# Patient Record
Sex: Female | Born: 1955
Health system: Southern US, Community
[De-identification: ages and names within clinical notes are randomized; demographics above are authoritative.]

## PROBLEM LIST (undated history)

## (undated) DIAGNOSIS — E663 Overweight: Secondary | ICD-10-CM

## (undated) DIAGNOSIS — S93402A Sprain of unspecified ligament of left ankle, initial encounter: Principal | ICD-10-CM

## (undated) DIAGNOSIS — Z86010 Personal history of colonic polyps: Secondary | ICD-10-CM

## (undated) DIAGNOSIS — K219 Gastro-esophageal reflux disease without esophagitis: Secondary | ICD-10-CM

## (undated) DIAGNOSIS — H269 Unspecified cataract: Secondary | ICD-10-CM

## (undated) DIAGNOSIS — I82409 Acute embolism and thrombosis of unspecified deep veins of unspecified lower extremity: Secondary | ICD-10-CM

## (undated) DIAGNOSIS — K7689 Other specified diseases of liver: Secondary | ICD-10-CM

## (undated) DIAGNOSIS — E042 Nontoxic multinodular goiter: Secondary | ICD-10-CM

## (undated) DIAGNOSIS — K5730 Diverticulosis of large intestine without perforation or abscess without bleeding: Secondary | ICD-10-CM

## (undated) DIAGNOSIS — T7840XA Allergy, unspecified, initial encounter: Secondary | ICD-10-CM

## (undated) DIAGNOSIS — M1990 Unspecified osteoarthritis, unspecified site: Secondary | ICD-10-CM

## (undated) DIAGNOSIS — N809 Endometriosis, unspecified: Secondary | ICD-10-CM

## (undated) DIAGNOSIS — E785 Hyperlipidemia, unspecified: Secondary | ICD-10-CM

## (undated) DIAGNOSIS — E041 Nontoxic single thyroid nodule: Secondary | ICD-10-CM

## (undated) DIAGNOSIS — E559 Vitamin D deficiency, unspecified: Secondary | ICD-10-CM

## (undated) DIAGNOSIS — F419 Anxiety disorder, unspecified: Secondary | ICD-10-CM

## (undated) DIAGNOSIS — C801 Malignant (primary) neoplasm, unspecified: Secondary | ICD-10-CM

## (undated) HISTORY — DX: Nontoxic multinodular goiter: E04.2

## (undated) HISTORY — DX: Nontoxic single thyroid nodule: E04.1

## (undated) HISTORY — PX: OTHER SURGICAL HISTORY: SHX169

## (undated) HISTORY — DX: Vitamin D deficiency, unspecified: E55.9

## (undated) HISTORY — DX: Gastro-esophageal reflux disease without esophagitis: K21.9

## (undated) HISTORY — PX: ENDOMETRIAL ABLATION: SHX621

## (undated) HISTORY — DX: Overweight: E66.3

## (undated) HISTORY — PX: THYROID CYST EXCISION: SHX2511

## (undated) HISTORY — DX: Unspecified cataract: H26.9

## (undated) HISTORY — DX: Personal history of colonic polyps: Z86.010

## (undated) HISTORY — DX: Diverticulosis of large intestine without perforation or abscess without bleeding: K57.30

## (undated) HISTORY — DX: Other specified diseases of liver: K76.89

## (undated) HISTORY — DX: Hyperlipidemia, unspecified: E78.5

## (undated) HISTORY — DX: Unspecified osteoarthritis, unspecified site: M19.90

## (undated) HISTORY — DX: Allergy, unspecified, initial encounter: T78.40XA

## (undated) HISTORY — DX: Sprain of unspecified ligament of left ankle, initial encounter: S93.402A

## (undated) HISTORY — DX: Acute embolism and thrombosis of unspecified deep veins of unspecified lower extremity: I82.409

## (undated) HISTORY — PX: COLONOSCOPY: SHX174

## (undated) HISTORY — DX: Malignant (primary) neoplasm, unspecified: C80.1

## (undated) HISTORY — DX: Endometriosis, unspecified: N80.9

## (undated) HISTORY — DX: Anxiety disorder, unspecified: F41.9

---

## 1999-10-23 ENCOUNTER — Other Ambulatory Visit: Admission: RE | Admit: 1999-10-23 | Discharge: 1999-10-23 | Payer: Self-pay | Admitting: Internal Medicine

## 2003-11-07 ENCOUNTER — Ambulatory Visit (HOSPITAL_COMMUNITY): Admission: RE | Admit: 2003-11-07 | Discharge: 2003-11-07 | Payer: Self-pay | Admitting: Surgery

## 2003-11-11 ENCOUNTER — Ambulatory Visit (HOSPITAL_COMMUNITY): Admission: RE | Admit: 2003-11-11 | Discharge: 2003-11-11 | Payer: Self-pay | Admitting: Surgery

## 2003-11-11 ENCOUNTER — Encounter (INDEPENDENT_AMBULATORY_CARE_PROVIDER_SITE_OTHER): Payer: Self-pay | Admitting: *Deleted

## 2004-03-23 ENCOUNTER — Ambulatory Visit (HOSPITAL_COMMUNITY): Admission: RE | Admit: 2004-03-23 | Discharge: 2004-03-23 | Payer: Self-pay | Admitting: Endocrinology

## 2005-05-07 ENCOUNTER — Ambulatory Visit (HOSPITAL_COMMUNITY): Admission: RE | Admit: 2005-05-07 | Discharge: 2005-05-07 | Payer: Self-pay | Admitting: Endocrinology

## 2005-08-23 LAB — HM COLONOSCOPY

## 2006-05-02 ENCOUNTER — Encounter: Admission: RE | Admit: 2006-05-02 | Discharge: 2006-05-02 | Payer: Self-pay | Admitting: Endocrinology

## 2006-05-06 ENCOUNTER — Encounter: Admission: RE | Admit: 2006-05-06 | Discharge: 2006-05-06 | Payer: Self-pay | Admitting: Orthopedic Surgery

## 2006-08-23 DIAGNOSIS — K5730 Diverticulosis of large intestine without perforation or abscess without bleeding: Secondary | ICD-10-CM

## 2006-08-23 DIAGNOSIS — Z860100 Personal history of colon polyps, unspecified: Secondary | ICD-10-CM

## 2006-08-23 HISTORY — DX: Personal history of colon polyps, unspecified: Z86.0100

## 2006-08-23 HISTORY — DX: Personal history of colonic polyps: Z86.010

## 2006-08-23 HISTORY — DX: Diverticulosis of large intestine without perforation or abscess without bleeding: K57.30

## 2006-09-14 ENCOUNTER — Ambulatory Visit (HOSPITAL_BASED_OUTPATIENT_CLINIC_OR_DEPARTMENT_OTHER): Admission: RE | Admit: 2006-09-14 | Discharge: 2006-09-14 | Payer: Self-pay | Admitting: Orthopedic Surgery

## 2006-09-20 ENCOUNTER — Encounter: Admission: RE | Admit: 2006-09-20 | Discharge: 2006-09-20 | Payer: Self-pay | Admitting: Orthopedic Surgery

## 2007-04-21 ENCOUNTER — Encounter: Admission: RE | Admit: 2007-04-21 | Discharge: 2007-04-21 | Payer: Self-pay | Admitting: Orthopedic Surgery

## 2007-08-04 ENCOUNTER — Ambulatory Visit: Payer: Self-pay | Admitting: Internal Medicine

## 2007-08-11 ENCOUNTER — Ambulatory Visit: Payer: Self-pay | Admitting: Internal Medicine

## 2007-08-11 ENCOUNTER — Encounter: Payer: Self-pay | Admitting: Internal Medicine

## 2007-08-24 HISTORY — PX: OTHER SURGICAL HISTORY: SHX169

## 2009-07-16 ENCOUNTER — Encounter: Admission: RE | Admit: 2009-07-16 | Discharge: 2009-07-16 | Payer: Self-pay | Admitting: Orthopedic Surgery

## 2010-04-23 HISTORY — PX: OTHER SURGICAL HISTORY: SHX169

## 2010-09-13 ENCOUNTER — Encounter: Payer: Self-pay | Admitting: Orthopedic Surgery

## 2010-11-24 ENCOUNTER — Ambulatory Visit (INDEPENDENT_AMBULATORY_CARE_PROVIDER_SITE_OTHER): Payer: Commercial Indemnity | Admitting: Family Medicine

## 2010-11-24 ENCOUNTER — Encounter: Payer: Self-pay | Admitting: Family Medicine

## 2010-11-24 DIAGNOSIS — K3189 Other diseases of stomach and duodenum: Secondary | ICD-10-CM

## 2010-11-24 DIAGNOSIS — E785 Hyperlipidemia, unspecified: Secondary | ICD-10-CM

## 2010-11-24 DIAGNOSIS — K219 Gastro-esophageal reflux disease without esophagitis: Secondary | ICD-10-CM

## 2010-11-24 DIAGNOSIS — J302 Other seasonal allergic rhinitis: Secondary | ICD-10-CM

## 2010-11-24 DIAGNOSIS — K5900 Constipation, unspecified: Secondary | ICD-10-CM

## 2010-11-24 DIAGNOSIS — R1032 Left lower quadrant pain: Secondary | ICD-10-CM

## 2010-11-24 DIAGNOSIS — C801 Malignant (primary) neoplasm, unspecified: Secondary | ICD-10-CM

## 2010-11-24 DIAGNOSIS — J309 Allergic rhinitis, unspecified: Secondary | ICD-10-CM

## 2010-11-24 DIAGNOSIS — E041 Nontoxic single thyroid nodule: Secondary | ICD-10-CM

## 2010-11-24 DIAGNOSIS — R1013 Epigastric pain: Secondary | ICD-10-CM

## 2010-11-24 MED ORDER — FAMOTIDINE 20 MG PO TABS: 20.0000 mg | ORAL_TABLET | Freq: Two times a day (BID) | ORAL | Status: DC

## 2010-11-24 MED ORDER — LORATADINE 10 MG PO TABS: 10.0000 mg | ORAL_TABLET | Freq: Every day | ORAL | Status: DC

## 2010-11-24 NOTE — Patient Instructions (Signed)
Abdominal Pain Abdominal pain can be caused by many things. Your caregiver decides the seriousness of your pain by an examination and possibly blood tests and X-rays. Many cases can be observed and treated at home. Most abdominal pain is not caused by a disease and will probably improve without treatment. However, in many cases, more time must pass before a clear cause of the pain can be found. Before that point, it may not be known if you need more testing, or if hospitalization or surgery is needed. HOME CARE INSTRUCTIONS  Do not take laxatives unless directed by your caregiver.   Take pain medicine only as directed by your caregiver.   Only take over-the-counter or prescription medicines for pain, discomfort, or fever as directed by your caregiver.    Slowly move to a bland diet as tolerated.  SEEK IMMEDIATE MEDICAL CARE IF:  The pain does not go away.   You or your child has an oral temperature above 104, not controlled by medicine.   You keep throwing up (vomiting).   The pain is felt only in portions of the abdomen. Pain in the right side could possibly be appendicitis. In an adult, pain in the left lower portion of the abdomen could be colitis or diverticulitis.   You pass bloody or black tarry stools.  MAKE SURE YOU:  Understand these instructions.   Will watch your condition.   Will get help right away if you are not doing well or get worse.  Document Released: 05/19/2005 Document Re-Released: 11/03/2009 [facility] Patient Information 2011 [place], [place].Allergies, Generic Allergies may happen from anything your body is sensitive to. This may be food, medicines, pollens, chemicals, and nearly anything around you in everyday life that produces allergens. An allergen is anything that causes an allergy producing substance. Heredity is often a factor in causing these problems. This means you may have some of the same allergies as your parents. Food allergies happen in all age  groups. Food allergies are some of the most severe and life threatening. Some common food allergies are cow's milk, seafood, eggs, nuts, wheat, and soybeans. SYMPTOMS  Swelling around the mouth.   An itchy red rash or hives.   Vomiting or diarrhea.   Difficulty breathing.  SEVERE ALLERGIC REACTIONS ARE LIFE-THREATENING.  This reaction is called anaphylaxis. It can cause the mouth and throat to swell and cause difficulty with breathing and swallowing. In severe reactions only a trace amount of food (for example, peanut oil in a salad) may cause death within seconds. Seasonal allergies occur in all age groups. These are seasonal because they usually occur during the same season every year. They may be a reaction to molds, grass pollens, or tree pollens. Other causes of problems are house dust mite allergens, pet dander, and mold spores. The symptoms often consist of nasal congestion, a runny itchy nose associated with sneezing, and tearing itchy eyes. There is often an associated itching of the mouth and ears. The problems happen when you come in contact with pollens and other allergens. Allergens are the particles in the air that the body reacts to with an allergic reaction. This causes you to release allergic antibodies. Through a chain of events, these eventually cause you to release histamine into the blood stream. Although it is meant to be protective to the body, it is this release that causes your discomfort. This is why you were given anti-histamines to feel better. If you are unable to pinpoint the offending allergen, it may be  determined by skin or blood testing. Allergies cannot be cured but can be controlled with medicine. Hay fever is a collection of all or some of the seasonal allergy problems. It may often be treated with simple over-the-counter medicine such as diphenhydramine. Take medicine as directed. Do not drink alcohol or drive while taking this medicine. Check with your caregiver or  package insert for child dosages. If these medicines are not effective, there are many new medicines your caregiver can prescribe. Stronger medicine such as nasal spray, eye drops, and corticosteroids may be used if the first things you try do not work well. Other treatments such as immunotherapy or desensitizing injections can be used if all else fails. Follow up with your caregiver if problems continue. These seasonal allergies are usually not life threatening. They are generally more of a nuisance that can often be handled using medicine. HOME CARE INSTRUCTIONS  If unsure what causes a reaction, keep a diary of foods eaten and symptoms that follow. Avoid foods that cause reactions.   If hives or rash are present:   Take medicine as directed.   You may use an over-the-counter antihistamine (diphenhydramine) for hives and itching as needed.   Apply cold compresses (cloths) to the skin or take baths in cool water. Avoid hot baths or showers. Heat will make a rash and itching worse.   If you are severely allergic:   Following a treatment for a severe reaction, hospitalization is often required for closer follow-up.   Wear a medic-alert bracelet or necklace stating the allergy.   You and your family must learn how to give adrenaline or use an anaphylaxis kit.   If you have had a severe reaction, always carry your anaphylaxis kit or EpiPen with you. Use this medicine as directed by your caregiver if a severe reaction is occurring. Failure to do so could have a fatal outcome.  SEE YOUR CAREGIVER IF:  You suspect a food allergy. Symptoms generally happen within 30 minutes of eating a food.   Your symptoms have not gone away within 2 days or are getting worse.   You develop new symptoms.   You want to retest yourself or your child with a food or drink you think causes an allergic reaction. Never do this if an anaphylactic reaction to that food or drink has happened before. Only do this  under the care of a caregiver.  SEEK IMMEDIATE MEDICAL CARE IF:  You have difficulty breathing, are wheezing, or have a tight feeling in your chest or throat.   You have a swollen mouth, or you have hives, swelling, or itching all over your body.   You have had a severe reaction that has responded to your anaphylaxis kit or an EpiPen. These reactions may return when the medicine has worn off. These reactions should be considered life threatening.  MAKE SURE YOU:   Understand these instructions.   Will watch your condition.   Will get help right away if you are not doing well or get worse.  Document Released: 11/02/2002 Document Re-Released: 08/31/2009 [facility] Patient Information 2011 [place], [place].Esophagitis (Heartburn) Esophagitis (heartburn) is a painful, burning sensation in the chest. It may feel worse in certain positions, such as lying down or bending over. It is caused by stomach acid backing up into the tube that carries food from the mouth down to the stomach (lower esophagus). TREATMENT There are a number of non-prescription medicines used to treat heartburn, including:  Antacids.   Acid reducers (also  called H-2 blockers).   Proton-pump inhibitors.  HOME CARE INSTRUCTIONS  Raise the head of your bead by putting blocks under the legs.   Eat 2-3 hours before going to bed.   Stop smoking.   Try to reach and maintain a healthy weight.   Do not eat just a few very large meals. Instead, eat many smaller meals throughout the day.   Try to identify foods and beverages that make your symptoms worse, and avoid these.   Avoid tight clothing.   Do not exercise right after eating.  SEEK IMMEDIATE MEDICAL CARE IF YOU:  Have severe chest pain that goes down your arm, or into your jaw or neck.   Feel sweaty, dizzy, or lightheaded.   Are short of breath.   Throw up (vomit) blood.   Have difficulty or pain with swallowing.   Have bloody or black, tarry stools.    Have bouts of heartburn more than three times a week for more than two weeks.  Document Released: 09/16/2004 Document Re-Released: 11/03/2009 [facility] Patient Information 2011 [place], [place]. Ok to alternate Ibuprofen and Tylenol for pain relief and seek care if no improvment

## 2010-11-25 ENCOUNTER — Encounter: Payer: Self-pay | Admitting: Family Medicine

## 2010-11-25 ENCOUNTER — Telehealth: Payer: Self-pay | Admitting: Family Medicine

## 2010-11-25 DIAGNOSIS — K5909 Other constipation: Secondary | ICD-10-CM | POA: Insufficient documentation

## 2010-11-25 DIAGNOSIS — E041 Nontoxic single thyroid nodule: Secondary | ICD-10-CM | POA: Insufficient documentation

## 2010-11-25 DIAGNOSIS — J302 Other seasonal allergic rhinitis: Secondary | ICD-10-CM | POA: Insufficient documentation

## 2010-11-25 DIAGNOSIS — E785 Hyperlipidemia, unspecified: Secondary | ICD-10-CM | POA: Insufficient documentation

## 2010-11-25 DIAGNOSIS — C801 Malignant (primary) neoplasm, unspecified: Secondary | ICD-10-CM | POA: Insufficient documentation

## 2010-11-25 DIAGNOSIS — K219 Gastro-esophageal reflux disease without esophagitis: Secondary | ICD-10-CM | POA: Insufficient documentation

## 2010-11-25 HISTORY — DX: Hyperlipidemia, unspecified: E78.5

## 2010-11-25 LAB — RENAL FUNCTION PANEL
Albumin: 4.1 g/dL (ref 3.5–5.2)
BUN: 9 mg/dL (ref 6–23)
CO2: 31 mEq/L (ref 19–32)
Calcium: 9.3 mg/dL (ref 8.4–10.5)
Chloride: 103 mEq/L (ref 96–112)

## 2010-11-25 LAB — URINALYSIS, ROUTINE W REFLEX MICROSCOPIC
Ketones, ur: NEGATIVE
Specific Gravity, Urine: 1.015 (ref 1.000–1.030)
Total Protein, Urine: NEGATIVE
Urine Glucose: NEGATIVE
pH: 7 (ref 5.0–8.0)

## 2010-11-25 NOTE — Assessment & Plan Note (Signed)
Previously aspirated and has not recurred

## 2010-11-25 NOTE — Telephone Encounter (Signed)
Patient called wanting to know about her radiology appointment and antibiotic.

## 2010-11-25 NOTE — Assessment & Plan Note (Signed)
Recently worsened,  Recommend avoid offending foods, increase Pepcid to bid and check a H Pylori. Report worsening symptoms

## 2010-11-25 NOTE — Assessment & Plan Note (Signed)
Patient follows with Dermatology annually and as needed, Dr Dinah Beers office. Encouraged her to continue the same, keep skin protected from sun.

## 2010-11-25 NOTE — Telephone Encounter (Signed)
Pt would like to wait until tomorrow when labs come back

## 2010-11-25 NOTE — Assessment & Plan Note (Signed)
Patient presently taking 4 Docusate Senna tabs daily if she does that she is able to move her bowels most days, if she does not she will go at least 3 days without a BM, encouraged her to add a fiber supplement daily with Metamucil or Benefiber and a probiotic such as Align or yogurt daily, maintain adequate hydration and exercise.

## 2010-11-25 NOTE — Progress Notes (Signed)
Sandra Gonzales [id number] [birth year] 11/25/2010      Progress Note New Patient  Subjective  Chief Complaint  Chief Complaint  Patient presents with  . Abdominal Pain    lower left abd X off and on 4-5 weeks  . Establish Care    new pt    HPI  Patient is in today for an urgent new patient appointment. Her biggest concern is some intermittent left lower artery pain. She reports the pain has been present off and on for about a month. Can last a day or more and happens roughly twice a week she notes it is slightly less uncomfortable she is standing up or lying down. Sitting increases the pain and bending is the worst. She says it localizes only the left lower quadrant he's had no other abdominal pain. She denies anorexia fevers, chills, change in bowel or bladder habits recently although she does have a long-standing history of constipation and she denies any radicular symptoms numbness or tingling down her legs. She does have poor senna S. daily to manage her constipation and this works on most occasions.  She also describes a 6 month history of worsening heartburn symptoms for which she's been taking Pepcid once daily. This helps to some degree but the symptoms return. She is to minimize spicy and fatty foods does not eat most of the temperature similar symptoms several times a week for which she uses Pepcid. She describes a burning and belching sensation in her substernal and oropharyngeal regions. Of note she did have a 24 hour period of nausea, chills, fevers about 3 weeks ago after coming in contact with a family member who had similar symptoms  Past Medical History  Diagnosis Date  . Allergy     hay fever  . Cancer     BCC on L lower eyelid, R nasal bridge &back  . GERD (gastroesophageal reflux disease)   . Overweight   . Constipation   . Benign thyroid cyst     previously drained, has not recurred  . Hyperlipidemia 11/25/2010    Past Surgical History  Procedure Date  . Basal  cell left lower eyelid 9/11    bcc removed from R nasal bridge and back also  . Torn miniscus left knee 1-09  . Thyroid cyst excision     aspirated  . Endometrial ablation 8 years ago    For clotting, no further bleeding    Family History  Problem Relation Age of Onset  . Other Father     Congestive heart failure  . Diabetes Father   . Diabetes Brother     controlled w/ diet  . Cancer Brother     basal cell  . Diabetes Paternal Grandmother     leg amputation  . Leukemia Paternal Grandfather     History   Social History  . Marital Status: Married    Spouse Name: N/A    Number of Children: N/A  . Years of Education: N/A   Occupational History  . Not on file.   Social History Main Topics  . Smoking status: Never Smoker   . Smokeless tobacco: Never Used  . Alcohol Use: No  . Drug Use: No  . Sexually Active: Yes -- Female partner(s)   Other Topics Concern  . Not on file   Social History Narrative  . No narrative on file    No current outpatient prescriptions on file prior to visit.    Allergies  Allergen Reactions  .  Sulfa Drugs Cross Reactors     Review of Systems  Review of Systems  Constitutional: Negative for fever, chills and malaise/fatigue.  HENT: Negative for hearing loss, nosebleeds and congestion.   Eyes: Negative for discharge.  Respiratory: Negative for cough, sputum production, shortness of breath and wheezing.   Cardiovascular: Negative for chest pain, palpitations and leg swelling.  Gastrointestinal: Positive for heartburn, nausea and constipation. Negative for vomiting, abdominal pain, diarrhea and blood in stool.       LLQ pain off and on for past month, last night it worsened to a 8 out of 10 and she could not find a comfortable position. Lying down and standing up were somewhat better than sitting and bending was the worst. She denies any f/c/n/v/recent change in bowel habits/anorexia or GU symptoms. Did have a 24 hour period of  nausea/fevers/chills about 3 weeks ago, her pain had been present prior to that  Genitourinary: Negative for dysuria, urgency, frequency and hematuria.  Musculoskeletal: Negative for myalgias, back pain and falls.  Skin: Negative for rash.  Neurological: Negative for dizziness, tremors, sensory change, focal weakness, loss of consciousness, weakness and headaches.  Endo/Heme/Allergies: Negative for polydipsia. Does not bruise/bleed easily.  Psychiatric/Behavioral: Negative for depression and suicidal ideas. The patient is not nervous/anxious and does not have insomnia.     Objective  BP 112/73  Pulse 67  Temp(Src) 98.1 F (36.7 C) (Oral)  Ht 5' 3.25" (1.607 m)  Wt 177 lb 6.4 oz (80.468 kg)  BMI 31.18 kg/m2  SpO2 98%  Physical Exam  Physical Exam  Constitutional: She is oriented to person, place, and time and well-developed, well-nourished, and in no distress. No distress.  HENT:  Head: Normocephalic and atraumatic.  Right Ear: External ear normal.  Left Ear: External ear normal.  Nose: Nose normal.  Mouth/Throat: Oropharynx is clear and moist. No oropharyngeal exudate.  Eyes: Conjunctivae are normal. Pupils are equal, round, and reactive to light. Right eye exhibits no discharge. Left eye exhibits no discharge. No scleral icterus.  Neck: Normal range of motion. Neck supple. No thyromegaly present.  Cardiovascular: Normal rate, regular rhythm, normal heart sounds and intact distal pulses.   No murmur heard. Pulmonary/Chest: Effort normal and breath sounds normal. No respiratory distress. She has no wheezes. She has no rales.  Abdominal: Soft. Bowel sounds are normal. She exhibits no distension and no mass. There is tenderness.    Musculoskeletal: Normal range of motion. She exhibits no edema and no tenderness.       Right hip: She exhibits normal strength, no tenderness and no crepitus.       Left hip: She exhibits normal strength, no tenderness and no deformity.    Lymphadenopathy:    She has no cervical adenopathy.  Neurological: She is alert and oriented to person, place, and time. She has normal reflexes. No cranial nerve deficit. Coordination normal.  Skin: Skin is warm and dry. No rash noted. She is not diaphoretic.  Psychiatric: Mood, memory and affect normal.       Assessment & Plan  GERD (gastroesophageal reflux disease) Recently worsened,  Recommend avoid offending foods, increase Pepcid to bid and check a H Pylori. Report worsening symptoms  Constipation Patient presently taking 4 Docusate Senna tabs daily if she does that she is able to move her bowels most days, if she does not she will go at least 3 days without a BM, encouraged her to add a fiber supplement daily with Metamucil or Benefiber and a probiotic  such as Align or yogurt daily, maintain adequate hydration and exercise.  Cancer Patient follows with Dermatology annually and as needed, Dr Dinah Beers office. Encouraged her to continue the same, keep skin protected from sun.  Hyperlipidemia Patient notes minimal history in the past will check FLP prior to next visit and she is encouraged to avoid trans fats.  Benign thyroid cyst Previously aspirated and has not recurred  Seasonal allergies Recent flare in symptoms recommend Loratadine daily and report if symptoms recur

## 2010-11-25 NOTE — Assessment & Plan Note (Signed)
Patient notes minimal history in the past will check FLP prior to next visit and she is encouraged to avoid trans fats.

## 2010-11-25 NOTE — Assessment & Plan Note (Signed)
Recent flare in symptoms recommend Loratadine daily and report if symptoms recur

## 2010-11-25 NOTE — Telephone Encounter (Signed)
Unfortunately her labs are not back yet. If her pain is persistent will go ahead and treat Diverticulitis empirically with Ciprofloxacin 250mg  po bid x 10 days, Metronidazole 500mg  po tid x 10 days and she should start a probiotic such as Align caps daily

## 2010-11-26 MED ORDER — CLARITHROMYCIN 500 MG PO TABS: 500.0000 mg | ORAL_TABLET | Freq: Two times a day (BID) | ORAL | Status: DC

## 2010-11-26 MED ORDER — AMOXICILLIN 500 MG PO TABS: 500.0000 mg | ORAL_TABLET | Freq: Two times a day (BID) | ORAL | Status: DC

## 2010-11-26 MED ORDER — OMEPRAZOLE 20 MG PO CPDR: 20.0000 mg | DELAYED_RELEASE_CAPSULE | Freq: Two times a day (BID) | ORAL | Status: DC

## 2010-11-26 NOTE — Progress Notes (Signed)
Addended by: Josph Macho on: 11/26/2010 11:49 AM   Modules accepted: Orders

## 2010-12-07 ENCOUNTER — Other Ambulatory Visit: Payer: Self-pay | Admitting: Family Medicine

## 2010-12-07 ENCOUNTER — Telehealth: Payer: Self-pay

## 2010-12-07 NOTE — Telephone Encounter (Signed)
Pt would like to go ahead and schedule an ultrasound for stomach at [facility] in [place]. Pt can't go this Wed or Thurs (april 18th or 19th). Pt states her abdominal pain is still present after finishing medication.

## 2010-12-07 NOTE — Telephone Encounter (Signed)
I left a message for the patient to CB, when I spoke with the patient earlier she said she wanted to wait until she was done with the antibiotics before she scheduled anything, I also let the patient know in the same message she can schedule the [place] herself ([phone]) since the orders are in Epic. Thanks!

## 2010-12-07 NOTE — Telephone Encounter (Signed)
Patient is having continuing pain in her abdomen, OK to order [place], please notify patient of appt

## 2010-12-11 ENCOUNTER — Ambulatory Visit
Admission: RE | Admit: 2010-12-11 | Discharge: 2010-12-11 | Disposition: A | Discharge status: Home / Self Care | Payer: Commercial Indemnity | Source: Ambulatory Visit | Attending: Family Medicine | Admitting: Family Medicine

## 2010-12-11 DIAGNOSIS — R1032 Left lower quadrant pain: Secondary | ICD-10-CM

## 2011-01-08 NOTE — Op Note (Signed)
Sandra Gonzales, Sandra Gonzales             ACCOUNT NO.:  [id number]   MEDICAL RECORD NO.:  [id number]          PATIENT TYPE:  AMB   LOCATION:  [facility]                         FACILITY:  [facility]   PHYSICIAN:  Ollen Gross, M.D.    DATE OF BIRTH:  1956-01-31   DATE OF PROCEDURE:  09/14/2006  DATE OF DISCHARGE:                               OPERATIVE REPORT   PREOPERATIVE DIAGNOSIS:  Left knee medial meniscal tear.   POSTOPERATIVE DIAGNOSIS:  Left knee medial meniscal tear, plus chondral  defect lateral femoral condyle.   PROCEDURE:  Left knee arthroscopy with medial meniscal debridement  chondroplasty.   SURGEON:  Ollen Gross, M.D.   ASSISTANT:  None.   ANESTHESIA:  General.   ESTIMATED BLOOD LOSS:  Minimal.   DRAINS:  None.   COMPLICATIONS:  None.  Condition stable to recovery.   BRIEF CLINICAL NOTE:  [name] is a 55 year old female several month history worsening  left knee pain, mechanical symptoms.  Exam and history suggested medial  meniscal tear confirmed by MRI.  She has failed nonoperative management  and presents for arthroscopy and debridement.   PROCEDURE:  After successful administration of general anesthetic, a  tourniquet placed high on the left thigh and left lower extremity  prepped and draped in the usual sterile fashion.  Standard superomedial  and inferolateral incisions were made, inflow cannula passed  superomedial, camera passed inferolateral.  Arthroscopic visualization  proceeds.  Undersurface of patella, trochlea looked normal.  The medial  and lateral gutters visualized and no loose bodies.  Flexion valgus  force applied to the knee and medial compartment was entered.  There is  a small tear at the junction of the body and posterior horn of the  medial meniscus.  Spinal needles used to localize the inferomedial  portal small incision made, dilator placed and probe placed.  The tear  is a cleavage type tear which is somewhat unstable.  The rest the  meniscus looks fine.  I debrided the tear back to a stable base with  baskets and 4.2 mm shaver.  It is probed again and found to be stable.  The chondral surfaces and medial femoral condyle, and tibial plateau  looked normal.  The intercondylar notch was visualized, ACL was normal.  Lateral compartment was entered and lateral meniscus is fine.  Lateral  tibia plateau was fine, but on the final lateral femoral condyle there  was an area that appeared to have chondral blistering.  I probed and  indeed it was delaminating from the underlying bone.  It was a small  area about 1 x 1 cm.  I debrided this back to a stable base with stable  cartilaginous edges.  I probed and again and the edges were found to be  stable with no further delamination.  I flexed the knee and there was no  other area that appeared abnormal.  We inspected the rest of the joint.  There were no other loose bodies.  The trochlea and patella.  Patella  looks normal.  The arthroscopic equipment is then removed from the  inferior  portals which were  closed with interrupted 4-0 nylon.  20 mL of 0.25%  Marcaine with epi are injected through the inflow cannula and that is  removed that portal closed with nylon.  A bulky sterile dressing applied  and she is awakened and transferred to recovery in stable condition.      Ollen Gross, M.D.  Electronically Signed     FA/MEDQ  D:  09/14/2006  T:  09/14/2006  Job:  [id number]

## 2011-01-22 HISTORY — PX: ABDOMINAL HYSTERECTOMY: SHX81

## 2011-01-22 LAB — HM PAP SMEAR

## 2011-02-11 LAB — HM PAP SMEAR

## 2012-03-06 ENCOUNTER — Encounter: Payer: Self-pay | Admitting: Family Medicine

## 2012-03-06 ENCOUNTER — Ambulatory Visit (INDEPENDENT_AMBULATORY_CARE_PROVIDER_SITE_OTHER): Payer: Commercial Indemnity | Admitting: Family Medicine

## 2012-03-06 VITALS — BP 131/85 | HR 70 | Temp 99.2°F | Ht 63.25 in | Wt 158.8 lb

## 2012-03-06 DIAGNOSIS — S93402A Sprain of unspecified ligament of left ankle, initial encounter: Secondary | ICD-10-CM | POA: Insufficient documentation

## 2012-03-06 DIAGNOSIS — S93409A Sprain of unspecified ligament of unspecified ankle, initial encounter: Secondary | ICD-10-CM

## 2012-03-06 HISTORY — DX: Sprain of unspecified ligament of left ankle, initial encounter: S93.402A

## 2012-03-06 NOTE — Patient Instructions (Addendum)
Ankle Sprain An ankle sprain is an injury to the strong, fibrous tissues (ligaments) that hold the bones of your ankle joint together.  CAUSES Ankle sprain usually is caused by a fall or by twisting your ankle. People who participate in sports are more prone to these types of injuries.  SYMPTOMS  Symptoms of ankle sprain include:  Pain in your ankle. The pain may be present at rest or only when you are trying to stand or walk.   Swelling.   Bruising. Bruising may develop immediately or within 1 to 2 days after your injury.   Difficulty standing or walking.  DIAGNOSIS  Your caregiver will ask you details about your injury and perform a physical exam of your ankle to determine if you have an ankle sprain. During the physical exam, your caregiver will press and squeeze specific areas of your foot and ankle. Your caregiver will try to move your ankle in certain ways. An X-ray exam may be done to be sure a bone was not broken or a ligament did not separate from one of the bones in your ankle (avulsion).  TREATMENT  Certain types of braces can help stabilize your ankle. Your caregiver can make a recommendation for this. Your caregiver may recommend the use of medication for pain. If your sprain is severe, your caregiver may refer you to a surgeon who helps to restore function to parts of your skeletal system (orthopedist) or a physical therapist. HOME CARE INSTRUCTIONS  Apply ice to your injury for 1 to 2 days or as directed by your caregiver. Applying ice helps to reduce inflammation and pain.  Put ice in a plastic bag.   Place a towel between your skin and the bag.   Leave the ice on for 15 to 20 minutes at a time, every 2 hours while you are awake.   Take over-the-counter or prescription medicines for pain, discomfort, or fever only as directed by your caregiver.   Keep your injured leg elevated, when possible, to lessen swelling.   If your caregiver recommends crutches, use them as  instructed. Gradually, put weight on the affected ankle. Continue to use crutches or a cane until you can walk without feeling pain in your ankle.   If you have a plaster splint, wear the splint as directed by your caregiver. Do not rest it on anything harder than a pillow the first 24 hours. Do not put weight on it. Do not get it wet. You may take it off to take a shower or bath.   You may have been given an elastic bandage to wear around your ankle to provide support. If the elastic bandage is too tight (you have numbness or tingling in your foot or your foot becomes cold and blue), adjust the bandage to make it comfortable.   If you have an air splint, you may blow more air into it or let air out to make it more comfortable. You may take your splint off at night and before taking a shower or bath.   Wiggle your toes in the splint several times per day if you are able.  SEEK MEDICAL CARE IF:   You have an increase in bruising, swelling, or pain.   Your toes feel cold.   Pain relief is not achieved with medication.  SEEK IMMEDIATE MEDICAL CARE IF: Your toes are numb or blue or you have severe pain. MAKE SURE YOU:   Understand these instructions.   Will watch your condition.     Will get help right away if you are not doing well or get worse.  Document Released: 08/09/2005 Document Revised: 07/29/2011 Document Reviewed: 03/13/2008 [facility] Patient Information 2012 [place], [place].   Elevate, ice, aspercreme at least twice a day Aleve twice daily with food for next 5 days and then as needed

## 2012-03-06 NOTE — Progress Notes (Signed)
Patient ID: Sandra Gonzales, female   DOB: 1956-05-05, 56 y.o.   MRN: [id number] Sandra Gonzales [id number] [birth year] 03/06/2012      Progress Note-Follow Up  Subjective  Chief Complaint  Chief Complaint  Patient presents with  . Ankle Pain    hurts to flex left ankle, swollen since Sat.    HPI  Patient is a 56 year old Caucasian female who is in today for left ankle pain. Pain is been present 2-3 days. She denies any trauma falls twists or previous injury. She denies any redness, warmth, fevers, myalgias, anorexia or signs of systemic disease. She said prior to this she did feels well. No chest pain, palpitations, shortness of breath. No personal or family history of gout. She reports maybe she had a bug bite along her left ankle and since then has had some mild swelling with the swelling she notes when she flexes her ankle she has pain in the ankle anteriorly but also radiating up the tibial plateau about halfway. Which relaxes her foot the pain resolves. It is been some mild swelling as well but no other significant symptoms. No numbness tingling or weakness in the rest of the foot.  Past Medical History  Diagnosis Date  . Allergy     hay fever  . Cancer     BCC on L lower eyelid, R nasal bridge &back  . GERD (gastroesophageal reflux disease)   . Overweight   . Constipation   . Benign thyroid cyst     previously drained, has not recurred  . Hyperlipidemia 11/25/2010  . Left ankle sprain 03/06/2012    Past Surgical History  Procedure Date  . Basal cell left lower eyelid 9/11    bcc removed from R nasal bridge and back also  . Torn miniscus left knee 1-09  . Thyroid cyst excision     aspirated  . Endometrial ablation 8 years ago    For clotting, no further bleeding  . Abdominal hysterectomy 01-22-11    still has ovaries    Family History  Problem Relation Age of Onset  . Other Father     Congestive heart failure  . Diabetes Father   . Diabetes Brother     controlled  w/ diet  . Cancer Brother     basal cell  . Diabetes Paternal Grandmother     leg amputation  . Leukemia Paternal Grandfather     History   Social History  . Marital Status: Married    Spouse Name: N/A    Number of Children: N/A  . Years of Education: N/A   Occupational History  . Not on file.   Social History Main Topics  . Smoking status: Never Smoker   . Smokeless tobacco: Never Used  . Alcohol Use: No  . Drug Use: No  . Sexually Active: Yes -- Female partner(s)   Other Topics Concern  . Not on file   Social History Narrative  . No narrative on file    Current Outpatient Prescriptions on File Prior to Visit  Medication Sig Dispense Refill  . estradiol (CLIMARA - DOSED IN MG/24 HR) 0.025 mg/24hr Place 1 patch onto the skin once a week.      . NON FORMULARY Stool softner w/ mild laxative- qd         Allergies  Allergen Reactions  . Sulfa Drugs Cross Reactors     Review of Systems  Review of Systems  Constitutional: Negative for fever and malaise/fatigue.  HENT:  Negative for congestion.   Eyes: Negative for discharge.  Respiratory: Negative for shortness of breath.   Cardiovascular: Negative for chest pain, palpitations and leg swelling.  Gastrointestinal: Negative for nausea, abdominal pain and diarrhea.  Genitourinary: Negative for dysuria.  Musculoskeletal: Positive for joint pain. Negative for falls.       Left ankle pain and swelling  Skin: Negative for rash.  Neurological: Negative for loss of consciousness and headaches.  Endo/Heme/Allergies: Negative for polydipsia.  Psychiatric/Behavioral: Negative for depression and suicidal ideas. The patient is not nervous/anxious and does not have insomnia.     Objective  BP 131/85  Pulse 70  Temp 99.2 F (37.3 C) (Temporal)  Ht 5' 3.25" (1.607 m)  Wt 158 lb 12.8 oz (72.031 kg)  BMI 27.91 kg/m2  SpO2 97%  Physical Exam  Physical Exam  Constitutional: She is oriented to person, place, and time and  well-developed, well-nourished, and in no distress. No distress.  HENT:  Head: Normocephalic and atraumatic.  Eyes: Conjunctivae are normal.  Neck: Neck supple. No thyromegaly present.  Cardiovascular: Normal rate, regular rhythm and normal heart sounds.   No murmur heard. Pulmonary/Chest: Effort normal and breath sounds normal. She has no wheezes.  Abdominal: She exhibits no distension and no mass.  Musculoskeletal: She exhibits edema and tenderness.       Trace swelling over anterior ankle, pain with flexion of ankle  Lymphadenopathy:    She has no cervical adenopathy.  Neurological: She is alert and oriented to person, place, and time.  Skin: Skin is warm and dry. No rash noted. She is not diaphoretic.  Psychiatric: Memory, affect and judgment normal.    Lab Results  Component Value Date   TSH 0.19* 11/24/2010   No results found for this basename: WBC, HGB, HCT, MCV, PLT   Lab Results  Component Value Date   CREATININE 0.6 11/24/2010   BUN 9 11/24/2010   NA 142 11/24/2010   K 4.4 11/24/2010   CL 103 11/24/2010   CO2 31 11/24/2010     Assessment & Plan  Left ankle sprain Denies trauma, warmth or signs of infection. Symptoms present for about 2 days. Possible bug bite before swelling occurred. Now has pain over top of base of foot and up anterior tibial plateau with flexion of ankle. No sign of infection but some mild swelling noted. Wrapped in ace bandage and she is encouraged to elevate, ice and apply aspercreme, report if no improvement. Given samples of Aleve to use bid for the next week and then as needed

## 2012-03-06 NOTE — Assessment & Plan Note (Addendum)
Denies trauma, warmth or signs of infection. Symptoms present for about 2 days. Possible bug bite before swelling occurred. Now has pain over top of base of foot and up anterior tibial plateau with flexion of ankle. No sign of infection but some mild swelling noted. Wrapped in ace bandage and she is encouraged to elevate, ice and apply aspercreme, report if no improvement. Given samples of Aleve to use bid for the next week and then as needed

## 2012-04-06 ENCOUNTER — Encounter: Payer: Self-pay | Admitting: Internal Medicine

## 2012-04-06 ENCOUNTER — Ambulatory Visit (INDEPENDENT_AMBULATORY_CARE_PROVIDER_SITE_OTHER): Payer: Commercial Indemnity | Admitting: Internal Medicine

## 2012-04-06 VITALS — BP 132/74 | HR 60 | Ht 63.25 in | Wt 155.0 lb

## 2012-04-06 DIAGNOSIS — K5900 Constipation, unspecified: Secondary | ICD-10-CM

## 2012-04-06 DIAGNOSIS — R1032 Left lower quadrant pain: Secondary | ICD-10-CM

## 2012-04-06 DIAGNOSIS — K5730 Diverticulosis of large intestine without perforation or abscess without bleeding: Secondary | ICD-10-CM

## 2012-04-06 DIAGNOSIS — Z86010 Personal history of colonic polyps: Secondary | ICD-10-CM

## 2012-04-06 NOTE — Patient Instructions (Addendum)
You have been scheduled for a CT scan of the abdomen and pelvis at [facility] ([address]---this is in the same building as [facility]).   You are scheduled on 04-11-12 at 4:00. You should arrive 15 minutes prior to your appointment time for registration. Please follow the written instructions below on the day of your exam:  WARNING: IF YOU ARE ALLERGIC TO IODINE/X-RAY DYE, PLEASE NOTIFY RADIOLOGY IMMEDIATELY AT [phone]! YOU WILL BE GIVEN A 13 HOUR PREMEDICATION PREP.  1) Do not eat or drink anything after 12:00pm (4 hours prior to your test) 2) You have been given 2 bottles of oral contrast to drink. The solution may taste better if refrigerated, but do NOT add ice or any other liquid to this solution. Shake well before drinking.    Drink 1 bottle of contrast @ 2:00 (2 hours prior to your exam)  Drink 1 bottle of contrast @ 3:00 (1 hour prior to your exam)  You may take any medications as prescribed with a small amount of water except for the following: Metformin, Glucophage, Glucovance, Avandamet, Riomet, Fortamet, Actoplus Met, Janumet, Glumetza or Metaglip. The above medications must be held the day of the exam AND 48 hours after the exam.  The purpose of you drinking the oral contrast is to aid in the visualization of your intestinal tract. The contrast solution may cause some diarrhea. Before your exam is started, you will be given a small amount of fluid to drink. Depending on your individual set of symptoms, you may also receive an intravenous injection of x-ray contrast/dye. Plan on being at [facility] for 30 minutes or long, depending on the type of exam you are having performed.  If you have any questions regarding your exam or if you need to reschedule, you may call the CT department at [phone] between the hours of 8:00 am and 5:00 pm, Monday-Friday.  ________________________________________________________________________

## 2012-04-09 ENCOUNTER — Encounter: Payer: Self-pay | Admitting: Internal Medicine

## 2012-04-09 NOTE — Progress Notes (Signed)
HISTORY OF PRESENT ILLNESS:  Sandra Gonzales is a 56 y.o. female with a history of GERD, basal cell carcinoma, hyperlipidemia, and hyperplastic colon polyps. She presents, at the request of Dr. Evlyn Kanner, today regarding chronic but progressive left lower quadrant pain. I saw the patient in December of 2008 as a direct referral for screening colonoscopy. At that time she was found to have several diminutive rectosigmoid colon polyps which were removed and found to be hyperplastic. Also moderate left-sided diverticulosis. She has not been seen since. Her current history is that of left lower quadrant discomfort or pain which began about 18 months ago. The discomfort occurs daily and is described as focal left lower quadrant dull aching. She does notice that is somewhat better when lying flat, possibly a bit more noticeable while sitting. Is not affected by other types of movement. She does not notice that symptoms are affected by meals. He does seem his symptoms are worse when she is constipated, and somewhat better after a good bowel movement. However, this will not relieve the discomfort entirely. Her bowels have been a bit more constipated recently. She requires daily stool softeners. She has had 25 pound weight loss, though she attributes this to insurance driven incentives and is the result of diet and exercise. She denies change in stool caliber, melena, or hematochezia. Because of the persisting nature of the discomfort she did undergo a gynecology evaluation in April of 2012. At that time, transverse abdominal and transvaginal ultrasound of the pelvis revealed uterine fibroids. She subsequently underwent hysterectomy. Despite this, and given her self enough time to heal, the pain has persisted. She actually thinks it's a bit more frequent and intense as well as noticeable over the past several months. She is concerned. She requires about the need for repeating colonoscopy since it has been about 5  years.  REVIEW OF SYSTEMS:  All non-GI ROS negative except for sinus and allergy trouble, night sweats  Past Medical History  Diagnosis Date  . Allergy     hay fever  . Cancer     BCC on L lower eyelid, R nasal bridge &back  . GERD (gastroesophageal reflux disease)   . Overweight   . Constipation   . Benign thyroid cyst     previously drained, has not recurred  . Hyperlipidemia 11/25/2010  . Left ankle sprain 03/06/2012  . Diverticulosis of colon (without mention of hemorrhage) 2008  . Hx of colonic polyps 2008    Hyperplastic     Past Surgical History  Procedure Date  . Basal cell left lower eyelid 9/11    bcc removed from R nasal bridge and back also  . Torn miniscus left knee 1-09  . Thyroid cyst excision     aspirated  . Endometrial ablation 8 years ago    For clotting, no further bleeding  . Abdominal hysterectomy 01-22-11    still has ovaries    Social History [employer]  reports that she has never smoked. She has never used smokeless tobacco. She reports that she does not drink alcohol or use illicit drugs.  family history includes Cancer in her brother; Diabetes in her brother, father, and paternal grandmother; Leukemia in her paternal grandfather; Lung cancer in her mother; and Other in her father.  There is no history of Colon cancer.  Allergies  Allergen Reactions  . Sulfa Drugs Cross Reactors        PHYSICAL EXAMINATION: Vital signs: BP 132/74  Pulse 60  Ht  5' 3.25" (1.607 m)  Wt 155 lb (70.308 kg)  BMI 27.24 kg/m2  Constitutional: generally well-appearing, no acute distress Psychiatric: alert and oriented x3, cooperative Eyes: extraocular movements intact, anicteric, conjunctiva pink Mouth: oral pharynx moist, no lesions Neck: supple no lymphadenopathy Cardiovascular: heart regular rate and rhythm, no murmur Lungs: clear to auscultation bilaterally Abdomen: soft, mild focal tenderness in the left lower quadrant to palpation, no rebound,  nondistended, no obvious ascites, no peritoneal signs, normal bowel sounds, no organomegaly Rectal:deferred until colonoscopy Extremities: no lower extremity edema bilaterally Skin: no lesions on visible extremities Neuro: No focal deficits. No asterixis.     ASSESSMENT:  #1. Chronic, persistent, and worsening left lower quadrant pain as described. No relief post hysterectomy for uterine fibroids. No diverticulosis and hyperplastic colon polyps on colonoscopy in 2008. Mild tenderness on exam without clear etiology for the discomfort ascertained. #2. Diverticulosis rule out diverticulitis #3. Constipation #4. History of hyperplastic colon polyps   PLAN:  #1. CT scan of the abdomen and pelvis with contrast. Rule out diverticulitis and/or mass lesion #2. Continue stool softeners #3. If CT negative, then proceed with colonoscopy to evaluate pain, constipation, and weight loss (though this is felt to be  Intentional and unrelated).The nature of the procedure, as well as the risks, benefits, and alternatives were carefully and thoroughly reviewed with the patient. Ample time for discussion and questions allowed. The patient understood, was satisfied, and agreed to proceed. Movi prep prescribed. The patient instructed on its use

## 2012-04-11 ENCOUNTER — Ambulatory Visit: Admission: RE | Admit: 2012-04-11 | Payer: Commercial Indemnity | Source: Ambulatory Visit

## 2012-04-11 ENCOUNTER — Ambulatory Visit
Admission: RE | Admit: 2012-04-11 | Discharge: 2012-04-11 | Disposition: A | Discharge status: Home / Self Care | Payer: Commercial Indemnity | Source: Ambulatory Visit | Attending: Internal Medicine | Admitting: Internal Medicine

## 2012-04-11 ENCOUNTER — Other Ambulatory Visit: Payer: Commercial Indemnity

## 2012-04-11 ENCOUNTER — Other Ambulatory Visit: Payer: Self-pay | Admitting: Internal Medicine

## 2012-04-11 DIAGNOSIS — R109 Unspecified abdominal pain: Secondary | ICD-10-CM

## 2012-04-11 MED ORDER — IOHEXOL 300 MG/ML  SOLN
100.0000 mL | Freq: Once | INTRAMUSCULAR | Status: AC | PRN
  Administered 2012-04-11: 100 mL via INTRAVENOUS

## 2012-04-14 ENCOUNTER — Ambulatory Visit (AMBULATORY_SURGERY_CENTER): Payer: Commercial Indemnity | Admitting: *Deleted

## 2012-04-14 VITALS — Ht 63.0 in | Wt 155.0 lb

## 2012-04-14 DIAGNOSIS — R1032 Left lower quadrant pain: Secondary | ICD-10-CM

## 2012-04-14 MED ORDER — MOVIPREP 100 G PO SOLR: ORAL | Status: DC

## 2012-05-04 ENCOUNTER — Ambulatory Visit (AMBULATORY_SURGERY_CENTER): Payer: Commercial Indemnity | Admitting: Internal Medicine

## 2012-05-04 ENCOUNTER — Encounter: Payer: Self-pay | Admitting: Internal Medicine

## 2012-05-04 VITALS — BP 117/65 | HR 60 | Temp 97.8°F | Resp 16 | Ht 63.0 in | Wt 155.0 lb

## 2012-05-04 DIAGNOSIS — R1032 Left lower quadrant pain: Secondary | ICD-10-CM

## 2012-05-04 DIAGNOSIS — Z1211 Encounter for screening for malignant neoplasm of colon: Secondary | ICD-10-CM

## 2012-05-04 DIAGNOSIS — Z86010 Personal history of colonic polyps: Secondary | ICD-10-CM

## 2012-05-04 LAB — HM COLONOSCOPY

## 2012-05-04 MED ORDER — SODIUM CHLORIDE 0.9 % IV SOLN: 500.0000 mL | INTRAVENOUS | Status: DC

## 2012-05-04 NOTE — Progress Notes (Addendum)
Patient did not have preoperative order for IV antibiotic SSI prophylaxis. (G8918)  Patient did not experience any of the following events: a burn prior to discharge; a fall within the facility; wrong site/side/patient/procedure/implant event; or a hospital transfer or hospital admission upon discharge from the facility. (G8907)  

## 2012-05-04 NOTE — Op Note (Signed)
[facility] [address] [place], [address]   COLONOSCOPY PROCEDURE REPORT  PATIENT: Sandra Gonzales, Sandra Gonzales  MR#: [id number] BIRTHDATE: 15-Jan-1956 , 56  yrs. old GENDER: Female ENDOSCOPIST: Roxy Cedar, MD REFERRED [id number] PROCEDURE DATE:  05/04/2012 PROCEDURE:   Colonoscopy, screening ASA CLASS:   Class I INDICATIONS:patient's personal history of colon polyps (HP 2008) and abdominal pain in the lower left quadrant. MEDICATIONS: MAC sedation, administered by CRNA and propofol (Diprivan) 370mg  IV  DESCRIPTION OF PROCEDURE:   After the risks benefits and alternatives of the procedure were thoroughly explained, informed consent was obtained.  A digital rectal exam revealed no abnormalities of the rectum.   The LB [id number]  endoscope was introduced through the anus and advanced to the cecum, which was identified by both the appendix and ileocecal valve. No adverse events experienced.   The quality of the prep was excellent, using MoviPrep  The instrument was then slowly withdrawn as the colon was fully examined.      COLON FINDINGS: Mild melanosis was found throughout the entire examined colon.   Mild diverticulosis was noted in the sigmoid colon.   The colon mucosa was otherwise normal.  Retroflexed views revealed no abnormalities. The time to cecum=2 minutes 51 seconds. Withdrawal time=11 minutes 05 seconds.  The scope was withdrawn and the procedure completed. COMPLICATIONS: There were no complications.  ENDOSCOPIC IMPRESSION: 1.   Mild melanosis was found throughout the entire examined colon 2.   Mild diverticulosis was noted in the sigmoid colon 3.   The colon mucosa was otherwise normal  RECOMMENDATIONS: 1. Continue current colorectal screening recommendations for "routine risk" patients with a repeat colonoscopy in 10 years.   eSigned:  Roxy Cedar, MD 05/04/2012 3:05 PM   cc: Reuel Derby, MD and The Patient

## 2012-05-04 NOTE — Patient Instructions (Addendum)
YOU HAD AN ENDOSCOPIC PROCEDURE TODAY AT THE [facility]: Refer to the procedure report that was given to you for any specific questions about what was found during the examination.  If the procedure report does not answer your questions, please call your gastroenterologist to clarify.  If you requested that your care partner not be given the details of your procedure findings, then the procedure report has been included in a sealed envelope for you to review at your convenience later.  YOU SHOULD EXPECT: Some feelings of bloating in the abdomen. Passage of more gas than usual.  Walking can help get rid of the air that was put into your GI tract during the procedure and reduce the bloating. If you had a lower endoscopy (such as a colonoscopy or flexible sigmoidoscopy) you may notice spotting of blood in your stool or on the toilet paper. If you underwent a bowel prep for your procedure, then you may not have a normal bowel movement for a few days.  DIET: Your first meal following the procedure should be a light meal and then it is ok to progress to your normal diet.  A half-sandwich or bowl of soup is an example of a good first meal.  Heavy or fried foods are harder to digest and may make you feel nauseous or bloated.  Likewise meals heavy in dairy and vegetables can cause extra gas to form and this can also increase the bloating.  Drink plenty of fluids but you should avoid alcoholic beverages for 24 hours.  ACTIVITY: Your care partner should take you home directly after the procedure.  You should plan to take it easy, moving slowly for the rest of the day.  You can resume normal activity the day after the procedure however you should NOT DRIVE or use heavy machinery for 24 hours (because of the sedation medicines used during the test).    SYMPTOMS TO REPORT IMMEDIATELY: A gastroenterologist can be reached at any hour.  During normal business hours, 8:30 AM to 5:00 PM Monday through Friday,  call [phone].  After hours and on weekends, please call the GI answering service at [phone] who will take a message and have the physician on call contact you.   Following lower endoscopy (colonoscopy or flexible sigmoidoscopy):  Excessive amounts of blood in the stool  Significant tenderness or worsening of abdominal pains  Swelling of the abdomen that is new, acute  Fever of 100F or higher  FOLLOW UP: If any biopsies were taken you will be contacted by phone or by letter within the next 1-3 weeks.  Call your gastroenterologist if you have not heard about the biopsies in 3 weeks.  Our staff will call the home number listed on your records the next business day following your procedure to check on you and address any questions or concerns that you may have at that time regarding the information given to you following your procedure. This is a courtesy call and so if there is no answer at the home number and we have not heard from you through the emergency physician on call, we will assume that you have returned to your regular daily activities without incident.  SIGNATURES/CONFIDENTIALITY: You and/or your care partner have signed paperwork which will be entered into your electronic medical record.  These signatures attest to the fact that that the information above on your After Visit Summary has been reviewed and is understood.  Full responsibility of the confidentiality of this   discharge information lies with you and/or your care-partner.   Thank-you for choosing us for your medical needs. 

## 2012-05-05 ENCOUNTER — Telehealth: Payer: Self-pay | Admitting: *Deleted

## 2012-05-05 NOTE — Telephone Encounter (Signed)
  Follow up Call-  Call back number 05/04/2012  Post procedure Call Back phone  # [phone]  Permission to leave phone message Yes     Patient questions:  Do you have a fever, pain , or abdominal swelling? no Pain Score  0 *  Have you tolerated food without any problems? yes  Have you been able to return to your normal activities? yes  Do you have any questions about your discharge instructions: Diet   no Medications  no Follow up visit  no  Do you have questions or concerns about your Care? no  Actions: * If pain score is 4 or above: No action needed, pain <4.

## 2012-05-13 LAB — HM COLONOSCOPY

## 2013-02-28 ENCOUNTER — Ambulatory Visit: Payer: Commercial Indemnity | Admitting: Internal Medicine

## 2013-03-26 ENCOUNTER — Ambulatory Visit (INDEPENDENT_AMBULATORY_CARE_PROVIDER_SITE_OTHER): Payer: Managed Care, Other (non HMO) | Admitting: Internal Medicine

## 2013-03-26 ENCOUNTER — Encounter: Payer: Self-pay | Admitting: Internal Medicine

## 2013-03-26 ENCOUNTER — Other Ambulatory Visit: Payer: Self-pay

## 2013-03-26 VITALS — BP 110/60 | HR 72 | Ht 63.25 in | Wt 174.2 lb

## 2013-03-26 DIAGNOSIS — K219 Gastro-esophageal reflux disease without esophagitis: Secondary | ICD-10-CM

## 2013-03-26 DIAGNOSIS — R1310 Dysphagia, unspecified: Secondary | ICD-10-CM

## 2013-03-26 MED ORDER — OMEPRAZOLE 40 MG PO CPDR: 40.0000 mg | DELAYED_RELEASE_CAPSULE | Freq: Every day | ORAL | Status: DC

## 2013-03-26 NOTE — Patient Instructions (Addendum)
You have been scheduled for an endoscopy with propofol. Please follow written instructions given to you at your visit today. If you use inhalers (even only as needed), please bring them with you on the day of your procedure. Your physician has requested that you go to www.startemmi.com and enter the access code given to you at your visit today. This web site gives a general overview about your procedure. However, you should still follow specific instructions given to you by our office regarding your preparation for the procedure.  We have sent the following medications to your pharmacy for you to pick up at your convenience: Omeprazole  

## 2013-03-26 NOTE — Progress Notes (Signed)
HISTORY OF PRESENT ILLNESS:  Sandra Gonzales is a 57 y.o. female with the below listed medical history who presents today regarding reflux symptoms and dysphagia. The patient was last evaluated in August of 2013 regarding left lower quadrant discomfort. See that dictation. CT scan of the abdomen and pelvis was negative except for scattered diverticula an incidental hepatic cysts. Colonoscopy September 2013 was negative except for diverticulosis and melanosis. Followup in 10 years recommended. The current history is 4 months of intermittent solid food dysphagia. Also problems with pyrosis and hoarseness. Device and her son-in-law, she has been taking Zegerid sporadically. This helps some. She does have chronic stable constipation for which she takes laxative. GI review of systems is otherwise negative REVIEW OF SYSTEMS:  All non-GI ROS negative except for sinus and allergy   Past Medical History  Diagnosis Date  . Allergy     hay fever  . Cancer     BCC on L lower eyelid, R nasal bridge &back  . GERD (gastroesophageal reflux disease)   . Overweight(278.02)   . Constipation   . Benign thyroid cyst     previously drained, has not recurred  . Hyperlipidemia 11/25/2010  . Left ankle sprain 03/06/2012  . Diverticulosis of colon (without mention of hemorrhage) 2008  . Hx of colonic polyps 2008    Hyperplastic   . Multinodular goiter     Past Surgical History  Procedure Laterality Date  . Basal cell left lower eyelid  9/11    bcc removed from R nasal bridge and back also  . Torn miniscus left knee  1-09  . Thyroid cyst excision      aspirated  . Endometrial ablation  8 years ago    For clotting, no further bleeding  . Abdominal hysterectomy  01-22-11    still has ovaries    Social History VERALYN LOPP  reports that she has never smoked. She has never used smokeless tobacco. She reports that she does not drink alcohol or use illicit drugs.  family history includes Cancer in her  brother; Diabetes in her brother, father, and paternal grandmother; Leukemia in her paternal grandfather; Lung cancer in her mother; and Other in her father.  There is no history of Colon cancer and Stomach cancer.  Allergies  Allergen Reactions  . Sulfa Drugs Cross Reactors     hallucinations       PHYSICAL EXAMINATION: Vital signs: BP 110/60  Pulse 72  Ht 5' 3.25" (1.607 m)  Wt 174 lb 4 oz (79.039 kg)  BMI 30.61 kg/m2 General: Well-developed, well-nourished, no acute distress HEENT: Sclerae are anicteric, conjunctiva pink. Oral mucosa intact Lungs: Clear Heart: Regular Abdomen: soft, nontender, nondistended, no obvious ascites, no peritoneal signs, normal bowel sounds. No organomegaly. Extremities: No edema Psychiatric: alert and oriented x3. Cooperative     ASSESSMENT:  #1. GERD #2. Intermittent solid food dysphagia. Rule out peptic stricture #3. Colonoscopy in 2008 and again in 2013 with diverticulosis. No neoplasia.   PLAN:  #1. Reflux precautions #2. Prescribe omeprazole 40 mg daily #3. Upper endoscopy with possible esophageal dilation.The nature of the procedure, as well as the risks, benefits, and alternatives were carefully and thoroughly reviewed with the patient. Ample time for discussion and questions allowed. The patient understood, was satisfied, and agreed to proceed. #4. Routine screening colonoscopy around September 2023

## 2013-04-12 ENCOUNTER — Encounter: Payer: Self-pay | Admitting: Internal Medicine

## 2013-06-07 ENCOUNTER — Encounter: Payer: Managed Care, Other (non HMO) | Admitting: Internal Medicine

## 2013-06-28 ENCOUNTER — Other Ambulatory Visit: Payer: Self-pay | Admitting: Internal Medicine

## 2013-06-28 ENCOUNTER — Encounter: Payer: Self-pay | Admitting: Internal Medicine

## 2013-06-28 ENCOUNTER — Ambulatory Visit (INDEPENDENT_AMBULATORY_CARE_PROVIDER_SITE_OTHER): Payer: Managed Care, Other (non HMO) | Admitting: Internal Medicine

## 2013-06-28 VITALS — BP 116/65 | HR 66 | Temp 98.3°F | Resp 18 | Wt 182.0 lb

## 2013-06-28 DIAGNOSIS — M1990 Unspecified osteoarthritis, unspecified site: Secondary | ICD-10-CM

## 2013-06-28 DIAGNOSIS — Z9079 Acquired absence of other genital organ(s): Secondary | ICD-10-CM

## 2013-06-28 DIAGNOSIS — F419 Anxiety disorder, unspecified: Secondary | ICD-10-CM

## 2013-06-28 DIAGNOSIS — I824Y9 Acute embolism and thrombosis of unspecified deep veins of unspecified proximal lower extremity: Secondary | ICD-10-CM

## 2013-06-28 DIAGNOSIS — Z90710 Acquired absence of both cervix and uterus: Secondary | ICD-10-CM | POA: Insufficient documentation

## 2013-06-28 DIAGNOSIS — N809 Endometriosis, unspecified: Secondary | ICD-10-CM

## 2013-06-28 DIAGNOSIS — N951 Menopausal and female climacteric states: Secondary | ICD-10-CM

## 2013-06-28 DIAGNOSIS — F411 Generalized anxiety disorder: Secondary | ICD-10-CM

## 2013-06-28 DIAGNOSIS — Z86718 Personal history of other venous thrombosis and embolism: Secondary | ICD-10-CM

## 2013-06-28 LAB — LIPID PANEL
LDL Cholesterol: 114 mg/dL — ABNORMAL HIGH (ref 0–99)
VLDL: 31 mg/dL (ref 0–40)

## 2013-06-28 LAB — CBC WITH DIFFERENTIAL/PLATELET
Eosinophils Absolute: 0.2 10*3/uL (ref 0.0–0.7)
Lymphs Abs: 2.4 10*3/uL (ref 0.7–4.0)
MCH: 28.4 pg (ref 26.0–34.0)
Neutrophils Relative %: 58 % (ref 43–77)
Platelets: 233 10*3/uL (ref 150–400)
RBC: 4.65 MIL/uL (ref 3.87–5.11)
WBC: 7.5 10*3/uL (ref 4.0–10.5)

## 2013-06-28 LAB — COMPREHENSIVE METABOLIC PANEL
ALT: 10 U/L (ref 0–35)
Alkaline Phosphatase: 75 U/L (ref 39–117)
CO2: 26 mEq/L (ref 19–32)
Sodium: 139 mEq/L (ref 135–145)
Total Bilirubin: 0.3 mg/dL (ref 0.3–1.2)
Total Protein: 7.4 g/dL (ref 6.0–8.3)

## 2013-06-28 LAB — VITAMIN D 25 HYDROXY (VIT D DEFICIENCY, FRACTURES): Vit D, 25-Hydroxy: 25 ng/mL — ABNORMAL LOW (ref 30–89)

## 2013-06-28 LAB — TSH: TSH: 0.595 u[IU]/mL (ref 0.350–4.500)

## 2013-06-28 MED ORDER — ALPRAZOLAM 0.5 MG PO TABS: ORAL_TABLET | ORAL | Status: DC

## 2013-06-28 NOTE — Patient Instructions (Signed)
Get Chillow cooling pillow   Get I-Cool over the counter  Take one pill daily  Schedule CPE in 2015  GEt 3D mm call [facility]  See me in 6 weeks 30 min

## 2013-06-28 NOTE — Progress Notes (Signed)
Subjective:    Patient ID: Sandra Gonzales, female    DOB: 10-06-1955, 57 y.o.   MRN: [id number]  HPI  Sandra Gonzales is a new pt here for first visit to establish care.  She is the Chiropodist at [employer].    PMH of Anxiety controlled with occasional Xanax ,  DJD of knees,  Basal cell skin cancer,  GERD,  MNG (Dr. Evlyn Kanner)  Endometriosis S/P hysterectomy and L oopherectomy.  She had a  L DVT post arthroscopic procedure.   Yari reports she is having increasing frequency of menopausal hot flushes.  They occur day and night with frequent night time awakening disturbing sleep.  She has a RX for Climara by her GYN but only uses in "infrequently" and not per prescribed directions.    No personal or FH of breast CAncer or other gyn cancers,  No MI,or CVA or liver disease,   .   As stated above pt had a post arthroscopic DVT.  Mother had lung cancer and did have a PE while being treated for lung cancer.  .  She is due for a mammogram and her annual exam.  Pt is S/P hysterectomy for endometriosis - no concern for malignancy or pre-malignancy per pt  Allergies  Allergen Reactions  . Sulfa Drugs Cross Reactors     hallucinations   Past Medical History  Diagnosis Date  . Allergy     hay fever  . Cancer     BCC on L lower eyelid, R nasal bridge &back  . GERD (gastroesophageal reflux disease)   . Overweight(278.02)   . Constipation   . Benign thyroid cyst     previously drained, has not recurred  . Hyperlipidemia 11/25/2010  . Left ankle sprain 03/06/2012  . Diverticulosis of colon (without mention of hemorrhage) 2008  . Hx of colonic polyps 2008    Hyperplastic   . Multinodular goiter    Past Surgical History  Procedure Laterality Date  . Basal cell left lower eyelid  9/11    bcc removed from R nasal bridge and back also  . Torn miniscus left knee  1-09  . Thyroid cyst excision      aspirated  . Endometrial ablation  8 years ago    For clotting, no further bleeding  . Abdominal  hysterectomy  01-22-11    still has ovaries   History   Social History  . Marital Status: Married    Spouse Name: N/A    Number of Children: 2  . Years of Education: N/A   Occupational History  . Assisted Living     Move in coordinator    Social History Main Topics  . Smoking status: Never Smoker   . Smokeless tobacco: Never Used  . Alcohol Use: No  . Drug Use: No  . Sexual Activity: Yes    Partners: Male   Other Topics Concern  . Not on file   Social History Narrative   Daily caffeine   Family History  Problem Relation Age of Onset  . Other Father     Congestive heart failure  . Diabetes Father   . Diabetes Brother     controlled w/ diet  . Cancer Brother     basal cell  . Diabetes Paternal Grandmother     leg amputation  . Leukemia Paternal Grandfather   . Colon cancer Neg Hx   . Stomach cancer Neg Hx   . Lung cancer Mother    Patient Active  Problem List   Diagnosis Date Noted  . Personal history of DVT (deep vein thrombosis) 06/28/2013  . Anxiety 06/28/2013  . DJD (degenerative joint disease) 06/28/2013  . Menopausal hot flushes 06/28/2013  . Endometriosis 06/28/2013  . S/P abdominal hysterectomy and left salpingo-oophorectomy 06/28/2013  . Left ankle sprain 03/06/2012  . Hyperlipidemia 11/25/2010  . Seasonal allergies   . Cancer   . GERD (gastroesophageal reflux disease)   . Constipation   . Benign thyroid cyst    Current Outpatient Prescriptions on File Prior to Visit  Medication Sig Dispense Refill  . Ibuprofen-Diphenhydramine Cit (ADVIL PM PO) Take 1 tablet by mouth at bedtime.      . NON FORMULARY Stool softner w/ mild laxative- qd       . omeprazole (PRILOSEC) 40 MG capsule Take 1 capsule (40 mg total) by mouth daily.  30 capsule  11  . estradiol (CLIMARA - DOSED IN MG/24 HR) 0.025 mg/24hr Place 1 patch onto the skin once a week.       No current facility-administered medications on file prior to visit.      Review of Systems See HPI     Objective:   Physical Exam  Physical Exam  Nursing note and vitals reviewed.  Constitutional: She is oriented to person, place, and time. She appears well-developed and well-nourished.  HENT:  Head: Normocephalic and atraumatic.  Cardiovascular: Normal rate and regular rhythm. Exam reveals no gallop and no friction rub.  No murmur heard.  Pulmonary/Chest: Breath sounds normal. She has no wheezes. She has no rales.  Neurological: She is alert and oriented to person, place, and time.  Skin: Skin is warm and dry.  Psychiatric: She has a normal mood and affect. Her behavior is normal.             Assessment & Plan:  Post menopausal hot flushes:  Worsening in frequency.  Pt is an appropriate candidate with respect to her symptoms.   I explained in great detail the results of the WHI and gave pt risk/benefit sheet.  Must certainly consider her DVT risk,  Noting however  that her DVT was in the setting of  provocation post procedure.  Will check Factor V leiden,  Get UTD mm with all appropriate labs .  Advised cooling pillow and genistein  (I-cool)  OTC for now.  Will further discuss at her CPE visit  Anxiety  OK to refill Xanax .  I discussed with pt ok to use Xanax for occasional insomnia - use only 2-3 times per week  GERD continue prilosec  MNG  Will checK TSH today   DJD of knee

## 2013-06-29 LAB — FACTOR 10 ASSAY: Factor X Activity: 139 % — ABNORMAL HIGH (ref 72–134)

## 2013-07-06 ENCOUNTER — Encounter: Payer: Self-pay | Admitting: *Deleted

## 2013-08-09 ENCOUNTER — Ambulatory Visit: Payer: Managed Care, Other (non HMO) | Admitting: Internal Medicine

## 2013-08-14 ENCOUNTER — Ambulatory Visit (INDEPENDENT_AMBULATORY_CARE_PROVIDER_SITE_OTHER): Payer: Managed Care, Other (non HMO) | Admitting: Internal Medicine

## 2013-08-14 ENCOUNTER — Encounter: Payer: Self-pay | Admitting: Internal Medicine

## 2013-08-14 VITALS — BP 130/80 | HR 72 | Temp 98.3°F | Resp 18

## 2013-08-14 DIAGNOSIS — F419 Anxiety disorder, unspecified: Secondary | ICD-10-CM

## 2013-08-14 DIAGNOSIS — F411 Generalized anxiety disorder: Secondary | ICD-10-CM

## 2013-08-14 DIAGNOSIS — K219 Gastro-esophageal reflux disease without esophagitis: Secondary | ICD-10-CM

## 2013-08-14 DIAGNOSIS — N951 Menopausal and female climacteric states: Secondary | ICD-10-CM

## 2013-08-14 DIAGNOSIS — Z86718 Personal history of other venous thrombosis and embolism: Secondary | ICD-10-CM

## 2013-08-14 DIAGNOSIS — E042 Nontoxic multinodular goiter: Secondary | ICD-10-CM

## 2013-08-14 DIAGNOSIS — Z0000 Encounter for general adult medical examination without abnormal findings: Secondary | ICD-10-CM

## 2013-08-14 DIAGNOSIS — E785 Hyperlipidemia, unspecified: Secondary | ICD-10-CM

## 2013-08-14 LAB — POCT URINALYSIS DIPSTICK
Bilirubin, UA: NEGATIVE
Glucose, UA: NEGATIVE
Nitrite, UA: NEGATIVE

## 2013-08-14 LAB — HEMOCCULT GUIAC POC 1CARD (OFFICE)

## 2013-08-14 NOTE — Patient Instructions (Signed)
Schedule bone density at [facility] with her mammogram    See me as needed  See your GYN  For estrogen patch

## 2013-08-19 DIAGNOSIS — E042 Nontoxic multinodular goiter: Secondary | ICD-10-CM | POA: Insufficient documentation

## 2013-08-19 NOTE — Progress Notes (Signed)
Subjective:    Patient ID: Sandra Gonzales, female    DOB: [birth year], 57 y.o.   MRN: [id number]  HPI  Sandra Gonzales is here for CPE.  MNG  She tells me this is followed by her endocrinologist Dr. Evlyn Kanner.     Menopausal hot flushes.  She has been prescribed a Climara patch by her GYN Dr. Truddie Crumble and she states this has helped quite a bit.  Pt does have a history of post surgical DVT.  She is negative for Factor V  Leiden.  Factor X assay was minimally elevated   GERD and dysphagia  She has seen Dr. Marina Goodell and has minimal  Symptoms on 40 mg of omeprazole.   Upper endoscopy with possible dilation was planned in October but I note procedure was cancelled.   She reports she had a basal cell skin cancer removed by [facility] Surgery of her left eyelid.    Anxiety  She uses Xanax minimally  She is S/P hysterectomy.    Mother had osteoporosis at an early age.    Allergies  Allergen Reactions  . Sulfa Drugs Cross Reactors     hallucinations   Past Medical History  Diagnosis Date  . Allergy     hay fever  . Cancer     BCC on L lower eyelid, R nasal bridge &back  . GERD (gastroesophageal reflux disease)   . Overweight(278.02)   . Constipation   . Benign thyroid cyst     previously drained, has not recurred  . Hyperlipidemia 11/25/2010  . Left ankle sprain 03/06/2012  . Diverticulosis of colon (without mention of hemorrhage) 2008  . Hx of colonic polyps 2008    Hyperplastic   . Multinodular goiter    Past Surgical History  Procedure Laterality Date  . Basal cell left lower eyelid  9/11    bcc removed from R nasal bridge and back also  . Torn miniscus left knee  1-09  . Thyroid cyst excision      aspirated  . Endometrial ablation  8 years ago    For clotting, no further bleeding  . Abdominal hysterectomy  01-22-11    still has ovaries   History   Social History  . Marital Status: Married    Spouse Name: N/A    Number of Children: 2  . Years of Education: N/A   Occupational  History  . Assisted Living     Move in coordinator    Social History Main Topics  . Smoking status: Never Smoker   . Smokeless tobacco: Never Used  . Alcohol Use: No  . Drug Use: No  . Sexual Activity: Yes    Partners: Male   Other Topics Concern  . Not on file   Social History Narrative   Daily caffeine   Family History  Problem Relation Age of Onset  . Other Father     Congestive heart failure  . Diabetes Father   . Diabetes Brother     controlled w/ diet  . Cancer Brother     basal cell  . Diabetes Paternal Grandmother     leg amputation  . Leukemia Paternal Grandfather   . Colon cancer Neg Hx   . Stomach cancer Neg Hx   . Lung cancer Mother    Patient Active Problem List   Diagnosis Date Noted  . Multinodular goiter 08/19/2013  . Personal history of DVT (deep vein thrombosis) 06/28/2013  . Anxiety 06/28/2013  . DJD (degenerative joint disease)  06/28/2013  . Menopausal hot flushes 06/28/2013  . Endometriosis 06/28/2013  . S/P abdominal hysterectomy and left salpingo-oophorectomy 06/28/2013  . Left ankle sprain 03/06/2012  . Hyperlipidemia 11/25/2010  . Seasonal allergies   . Cancer   . GERD (gastroesophageal reflux disease)   . Constipation   . Benign thyroid cyst    Current Outpatient Prescriptions on File Prior to Visit  Medication Sig Dispense Refill  . ALPRAZolam (XANAX) 0.5 MG tablet Take one tablet  At HS  2 times per week  30 tablet  0  . estradiol (CLIMARA - DOSED IN MG/24 HR) 0.025 mg/24hr Place 1 patch onto the skin once a week.      . Ibuprofen-Diphenhydramine Cit (ADVIL PM PO) Take 1 tablet by mouth at bedtime.      . NON FORMULARY Stool softner w/ mild laxative- qd       . omeprazole (PRILOSEC) 40 MG capsule Take 1 capsule (40 mg total) by mouth daily.  30 capsule  11  . OVER THE COUNTER MEDICATION 1 capsule by Per post-pyloric tube route daily. Stool softner       No current facility-administered medications on file prior to visit.       Review of Systems  All other systems reviewed and are negative.       Objective:   Physical Exam Physical Exam  Nursing note and vitals reviewed.  Constitutional: She is oriented to person, place, and time. She appears well-developed and well-nourished.  HENT:  Head: Normocephalic and atraumatic.  Right Ear: Tympanic membrane and ear canal normal. No drainage. Tympanic membrane is not injected and not erythematous.  Left Ear: Tympanic membrane and ear canal normal. No drainage. Tympanic membrane is not injected and not erythematous.  Nose: Nose normal. Right sinus exhibits no maxillary sinus tenderness and no frontal sinus tenderness. Left sinus exhibits no maxillary sinus tenderness and no frontal sinus tenderness.  Mouth/Throat: Oropharynx is clear and moist. No oral lesions. No oropharyngeal exudate.  Eyes: Conjunctivae and EOM are normal. Pupils are equal, round, and reactive to light.  Neck: Normal range of motion. Neck supple. No JVD present. Carotid bruit is not present. No mass and no thyromegaly present.  Cardiovascular: Normal rate, regular rhythm, S1 normal, S2 normal and intact distal pulses. Exam reveals no gallop and no friction rub.  No murmur heard.  Pulses:  Carotid pulses are 2+ on the right side, and 2+ on the left side.  Dorsalis pedis pulses are 2+ on the right side, and 2+ on the left side.  No carotid bruit. No LE edema  Pulmonary/Chest: Breath sounds normal. She has no wheezes. She has no rales. She exhibits no tenderness.   Breast  No discrete masses no nipple discharge no axillary adenopathy bilaterally.  Abdominal: Soft. Bowel sounds are normal. She exhibits no distension and no mass. There is no hepatosplenomegaly. There is no tenderness. There is no CVA tenderness.  Musculoskeletal: Normal range of motion.  No active synovitis to joints.  Lymphadenopathy:  She has no cervical adenopathy.  She has no axillary adenopathy.  Right: No inguinal and no  supraclavicular adenopathy present.  Left: No inguinal and no supraclavicular adenopathy present.  Neurological: She is alert and oriented to person, place, and time. She has normal strength and normal reflexes. She displays no tremor. No cranial nerve deficit or sensory deficit. Coordination and gait normal.  Skin: Skin is warm and dry. No rash noted. No cyanosis. Nails show no clubbing.  Psychiatric: She has  a normal mood and affect. Her speech is normal and behavior is normal. Cognition and memory are normal.           Assessment & Plan:  Health Maintenance:  Had influenza.  Will check with insurance regarding  Zostavax.    Will schedule 3 D mm with bone density    History of MNG  Euthyroid biochemically.  Goiter managed by her endocrinologist Dr. Evlyn Kanner  I cannot palpate any discrete thyroid nodules today on exam  Minimal hyperlipidemia  DASH diet given  Menopausal hot flushes.  On Climara patch per her GYN.   I discussed risk of DVT and that science is unclear if estrogen safe with a history of provoked DVT.  She is factor V Leiden negative.  Discussed evaluation by hematology but pt declines.  She is to discuss length of time of continueation of Climara with her GYN.  History of basal cell skin  CA  Minimal  Vitamin D deficiency  Advised [facility] vitamin D

## 2013-08-20 ENCOUNTER — Telehealth: Payer: Self-pay | Admitting: *Deleted

## 2013-08-20 NOTE — Telephone Encounter (Signed)
Called [place] and left a message on her voicemail with appointment information for her Mammo and Dexa with [facility].  08/24/13 at 11:30 am.  Let her know no metal zippers or buttons.  No calcium supplements day before of day of testing.  No lotions, powders, or deodorant.

## 2013-09-09 ENCOUNTER — Telehealth: Payer: Self-pay | Admitting: Internal Medicine

## 2013-09-09 ENCOUNTER — Encounter: Payer: Self-pay | Admitting: Internal Medicine

## 2013-09-09 DIAGNOSIS — Z9289 Personal history of other medical treatment: Secondary | ICD-10-CM | POA: Insufficient documentation

## 2013-09-09 NOTE — Telephone Encounter (Signed)
Sandra Gonzales  Call pt and let her know that I have received her bone density report.  She has osteopenia  (early bone thinning)  She does not have osteoporosis  She should just take calcium 1200-1500 mg daily and vitamin D [phone] units daily

## 2013-09-10 ENCOUNTER — Telehealth: Payer: Self-pay | Admitting: *Deleted

## 2013-09-10 NOTE — Telephone Encounter (Signed)
Notified pt of bone density results. Advised ot to continue Calcium and Vit D

## 2013-09-12 ENCOUNTER — Encounter: Payer: Self-pay | Admitting: *Deleted

## 2013-09-27 ENCOUNTER — Encounter (HOSPITAL_BASED_OUTPATIENT_CLINIC_OR_DEPARTMENT_OTHER): Payer: Self-pay | Admitting: Emergency Medicine

## 2013-09-27 ENCOUNTER — Emergency Department (HOSPITAL_BASED_OUTPATIENT_CLINIC_OR_DEPARTMENT_OTHER)
Admission: EM | Admit: 2013-09-27 | Discharge: 2013-09-27 | Disposition: A | Discharge status: Home / Self Care | Payer: Managed Care, Other (non HMO) | Attending: Emergency Medicine | Admitting: Emergency Medicine

## 2013-09-27 ENCOUNTER — Emergency Department (HOSPITAL_BASED_OUTPATIENT_CLINIC_OR_DEPARTMENT_OTHER): Payer: Managed Care, Other (non HMO)

## 2013-09-27 DIAGNOSIS — IMO0001 Reserved for inherently not codable concepts without codable children: Secondary | ICD-10-CM | POA: Insufficient documentation

## 2013-09-27 DIAGNOSIS — Z860100 Personal history of colon polyps, unspecified: Secondary | ICD-10-CM | POA: Insufficient documentation

## 2013-09-27 DIAGNOSIS — K219 Gastro-esophageal reflux disease without esophagitis: Secondary | ICD-10-CM | POA: Insufficient documentation

## 2013-09-27 DIAGNOSIS — R42 Dizziness and giddiness: Secondary | ICD-10-CM | POA: Insufficient documentation

## 2013-09-27 DIAGNOSIS — R6889 Other general symptoms and signs: Secondary | ICD-10-CM

## 2013-09-27 DIAGNOSIS — Z8639 Personal history of other endocrine, nutritional and metabolic disease: Secondary | ICD-10-CM | POA: Insufficient documentation

## 2013-09-27 DIAGNOSIS — Z87828 Personal history of other (healed) physical injury and trauma: Secondary | ICD-10-CM | POA: Insufficient documentation

## 2013-09-27 DIAGNOSIS — Z85828 Personal history of other malignant neoplasm of skin: Secondary | ICD-10-CM | POA: Insufficient documentation

## 2013-09-27 DIAGNOSIS — J0100 Acute maxillary sinusitis, unspecified: Secondary | ICD-10-CM | POA: Insufficient documentation

## 2013-09-27 DIAGNOSIS — J111 Influenza due to unidentified influenza virus with other respiratory manifestations: Secondary | ICD-10-CM | POA: Insufficient documentation

## 2013-09-27 DIAGNOSIS — R110 Nausea: Secondary | ICD-10-CM | POA: Insufficient documentation

## 2013-09-27 DIAGNOSIS — R079 Chest pain, unspecified: Secondary | ICD-10-CM | POA: Insufficient documentation

## 2013-09-27 DIAGNOSIS — Z862 Personal history of diseases of the blood and blood-forming organs and certain disorders involving the immune mechanism: Secondary | ICD-10-CM | POA: Insufficient documentation

## 2013-09-27 DIAGNOSIS — Z79899 Other long term (current) drug therapy: Secondary | ICD-10-CM | POA: Insufficient documentation

## 2013-09-27 MED ORDER — PHENYLEPH-PROMETHAZINE-COD 5-6.25-10 MG/5ML PO SYRP: 5.0000 mL | ORAL_SOLUTION | ORAL | Status: DC | PRN

## 2013-09-27 MED ORDER — HYDROCOD POLST-CHLORPHEN POLST 10-8 MG/5ML PO LQCR
5.0000 mL | Freq: Once | ORAL | Status: AC
  Administered 2013-09-27: 5 mL via ORAL
  Filled 2013-09-27: qty 5

## 2013-09-27 MED ORDER — IBUPROFEN 400 MG PO TABS
600.0000 mg | ORAL_TABLET | Freq: Once | ORAL | Status: AC
  Administered 2013-09-27: 600 mg via ORAL
  Filled 2013-09-27 (×2): qty 1

## 2013-09-27 MED ORDER — AMOXICILLIN-POT CLAVULANATE 875-125 MG PO TABS: 1.0000 | ORAL_TABLET | Freq: Two times a day (BID) | ORAL | Status: DC

## 2013-09-27 MED ORDER — IPRATROPIUM-ALBUTEROL 0.5-2.5 (3) MG/3ML IN SOLN
3.0000 mL | RESPIRATORY_TRACT | Status: DC
  Administered 2013-09-27: 3 mL via RESPIRATORY_TRACT
  Filled 2013-09-27: qty 3

## 2013-09-27 NOTE — ED Provider Notes (Signed)
Medical screening examination/treatment/procedure(s) were performed by non-physician practitioner and as supervising physician I was immediately available for consultation/collaboration.  EKG Interpretation   None         Kaesyn Johnston B. Karle Starch, MD 09/27/13 2320

## 2013-09-27 NOTE — Discharge Instructions (Signed)
Take the medication as directed. Do not drive if you are taking the cough medication as it will make you sleepy. Return immediately for worsening symptoms such as severe headache, stiff neck, high fever, persistent vomiting or other problems.

## 2013-09-27 NOTE — ED Provider Notes (Signed)
CSN: [id number]     Arrival date & time 09/27/13  1444 History   First MD Initiated Contact with Patient 09/27/13 1446     Chief Complaint  Patient presents with  . URI   (Consider location/radiation/quality/duration/timing/severity/associated sxs/prior Treatment) Patient is a 58 y.o. female presenting with URI. The history is provided by the patient.  URI Presenting symptoms: congestion and cough   Presenting symptoms: no sore throat   Severity:  Moderate Onset quality:  Gradual Progression:  Worsening Chronicity:  New Relieved by:  Nothing Worsened by:  Nothing tried Ineffective treatments:  OTC medications Associated symptoms: headaches, myalgias, sinus pain, sneezing and wheezing   Associated symptoms: no neck pain   Risk factors: sick contacts   Risk factors: no chronic cardiac disease, no chronic kidney disease, no chronic respiratory disease, no diabetes mellitus and no recent travel    JAKKI DOUGHTY is a 58 y.o. female who presents to the ED with cough and congestion that started 4 days ago.  Past Medical History  Diagnosis Date  . Allergy     hay fever  . Cancer     BCC on L lower eyelid, R nasal bridge &back  . GERD (gastroesophageal reflux disease)   . Overweight   . Constipation   . Benign thyroid cyst     previously drained, has not recurred  . Hyperlipidemia 11/25/2010  . Left ankle sprain 03/06/2012  . Diverticulosis of colon (without mention of hemorrhage) 2008  . Hx of colonic polyps 2008    Hyperplastic   . Multinodular goiter    Past Surgical History  Procedure Laterality Date  . Basal cell left lower eyelid  9/11    bcc removed from R nasal bridge and back also  . Torn miniscus left knee  1-09  . Thyroid cyst excision      aspirated  . Endometrial ablation  8 years ago    For clotting, no further bleeding  . Abdominal hysterectomy  01-22-11    still has ovaries   Family History  Problem Relation Age of Onset  . Other Father     Congestive  heart failure  . Diabetes Father   . Diabetes Brother     controlled w/ diet  . Cancer Brother     basal cell  . Diabetes Paternal Grandmother     leg amputation  . Leukemia Paternal Grandfather   . Colon cancer Neg Hx   . Stomach cancer Neg Hx   . Lung cancer Mother    History  Substance Use Topics  . Smoking status: Never Smoker   . Smokeless tobacco: Never Used  . Alcohol Use: No   OB History   Grav Para Term Preterm Abortions TAB SAB Ect Mult Living   3    1  1   2      Review of Systems  HENT: Positive for congestion, sinus pressure and sneezing. Negative for facial swelling and sore throat.   Eyes: Negative for discharge, itching and visual disturbance.  Respiratory: Positive for cough and wheezing.   Gastrointestinal: Positive for nausea. Negative for abdominal pain and diarrhea. Vomiting: with cough only.  Genitourinary: Negative for dysuria, urgency and frequency.  Musculoskeletal: Positive for myalgias. Negative for neck pain.  Skin: Negative for rash.  Neurological: Positive for light-headedness and headaches. Negative for syncope.  Psychiatric/Behavioral: Negative for confusion. The patient is not nervous/anxious.     Allergies  Sulfa drugs cross reactors  Home Medications   Current Outpatient  Rx  Name  Route  Sig  Dispense  Refill  . ALPRAZolam (XANAX) 0.5 MG tablet      Take one tablet  At HS  2 times per week   30 tablet   0   . estradiol (CLIMARA - DOSED IN MG/24 HR) 0.025 mg/24hr   Transdermal   Place 1 patch onto the skin once a week.         . Ibuprofen-Diphenhydramine Cit (ADVIL PM PO)   Oral   Take 1 tablet by mouth at bedtime.         . NON FORMULARY      Stool softner w/ mild laxative- qd          . omeprazole (PRILOSEC) 40 MG capsule   Oral   Take 1 capsule (40 mg total) by mouth daily.   30 capsule   11   . OVER THE COUNTER MEDICATION   Per post-pyloric tube   1 capsule by Per post-pyloric tube route daily. Stool  softner          BP 130/54  Pulse 81  Temp(Src) 98.1 F (36.7 C) (Oral)  Resp 18  Ht 5\' 3"  (1.6 m)  Wt 182 lb (82.555 kg)  BMI 32.25 kg/m2  SpO2 97% Physical Exam  Nursing note and vitals reviewed. Constitutional: She is oriented to person, place, and time. She appears well-developed and well-nourished. No distress.  HENT:  Head: Normocephalic and atraumatic.  Right Ear: Tympanic membrane normal.  Left Ear: Tympanic membrane normal.  Nose: Mucosal edema and rhinorrhea present. Right sinus exhibits maxillary sinus tenderness. Left sinus exhibits maxillary sinus tenderness.  Mouth/Throat: Uvula is midline, oropharynx is clear and moist and mucous membranes are normal.  Eyes: EOM are normal.  Neck: Neck supple.  Cardiovascular: Normal rate.   Pulmonary/Chest: Effort normal. Wheezes: occasional. She has no rales. She exhibits tenderness.  Abdominal: Soft. There is no tenderness.  Musculoskeletal: Normal range of motion.  Lymphadenopathy:    She has no cervical adenopathy.  Neurological: She is alert and oriented to person, place, and time. No cranial nerve deficit.  Skin: Skin is warm and dry.  Psychiatric: She has a normal mood and affect. Her behavior is normal.   Dg Chest 2 View  09/27/2013   CLINICAL DATA:  Cough, congestion  EXAM: CHEST  2 VIEW  COMPARISON:  None.  FINDINGS: The lungs are clear and negative for focal airspace consolidation, pulmonary edema or suspicious pulmonary nodule. No pleural effusion or pneumothorax. Cardiac and mediastinal contours are within normal limits. No acute fracture or lytic or blastic osseous lesions. The visualized upper abdominal bowel gas pattern is unremarkable.  IMPRESSION: No active cardiopulmonary disease.   Electronically Signed   By: Malachy Moan M.D.   On: 09/27/2013 15:50    ED Course  Procedures   MDM  SUBJECTIVE:  GLANDA COZAD is a 58 y.o. female who present complaining of flu-like symptoms: fevers, chills, myalgias,  congestion, sore throat and cough for 4 days. Denies dyspnea. Also with sinus tenderness.   OBJECTIVE: Appears moderately ill but not toxic; temperature as noted in vitals. Ears normal. Throat and pharynx normal.  Neck supple. No adenopathy in the neck. Sinuses non tender. The chest is clear.  ASSESSMENT: Influenza  PLAN: Symptomatic therapy suggested: rest, increase fluids, gargle prn for sore throat, use mist of vaporizer prn and return if symptoms persist or worsen.  Will treat for bronchitis and sinusitis.  Discussed with the patient and all  questioned fully answered.    Medication List    TAKE these medications       amoxicillin-clavulanate 875-125 MG per tablet  Commonly known as:  AUGMENTIN  Take 1 tablet by mouth 2 (two) times daily.     Phenyleph-Promethazine-Cod 5-6.25-10 MG/5ML Syrp  Commonly known as:  PROMETHAZINE VC/CODEINE  Take 5 mLs by mouth every 4 (four) hours as needed.      ASK your doctor about these medications       ADVIL PM PO  Take 1 tablet by mouth at bedtime.     ALPRAZolam 0.5 MG tablet  Commonly known as:  XANAX  Take one tablet  At HS  2 times per week     estradiol 0.025 mg/24hr patch  Commonly known as:  CLIMARA - Dosed in mg/24 hr  Place 1 patch onto the skin once a week.     NON FORMULARY  Stool softner w/ mild laxative- qd     omeprazole 40 MG capsule  Commonly known as:  PRILOSEC  Take 1 capsule (40 mg total) by mouth daily.     OVER THE COUNTER MEDICATION  1 capsule by Per post-pyloric tube route daily. Stool softner         Ashley [place], [place] 09/27/13 2225

## 2013-09-27 NOTE — ED Notes (Signed)
Pt c/o URi symptoms  with pro cough x 4 days

## 2014-02-02 IMAGING — CT CT ABD-PELV W/ CM
1 of 5 series · 14 of 36 positions shown, 19 images · IV contrast (READICAT/WATER & [ID] OMNI 300)
Comparison: None.

CLINICAL DATA: Chronic but increasing left lower quadrant pain,
history of hysterectomy and left oophorectomy in January 2011

CT ABDOMEN AND PELVIS WITH CONTRAST
TECHNIQUE: Multidetector CT imaging of the abdomen and pelvis was
performed following the standard protocol during bolus
administration of intravenous contrast.
Contrast: 100mL OMNIPAQUE IOHEXOL 300 MG/ML  SOLN

[Series 2: abd/pelvis with · axial · 0.70mm/px · z∈[-362,+18]mm · 14 of 86 slices shown, 19 images]
[im 5/86  soft-tissue]
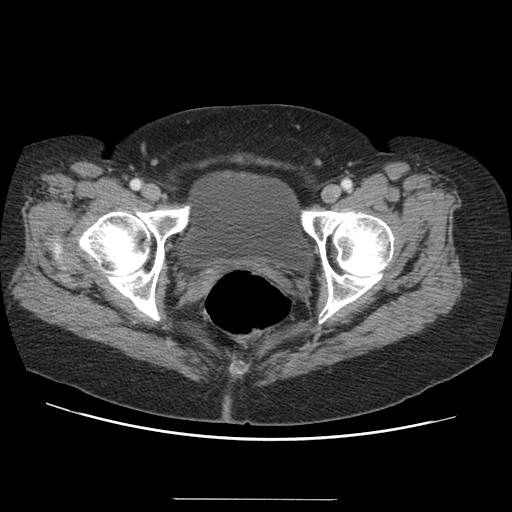
[im 5/86  bone]
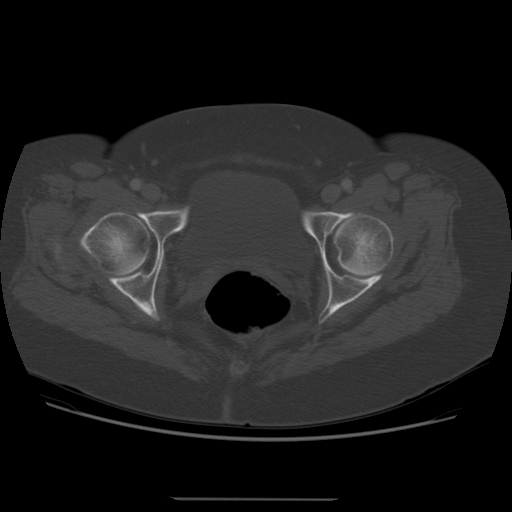
[im 10/86  soft-tissue]
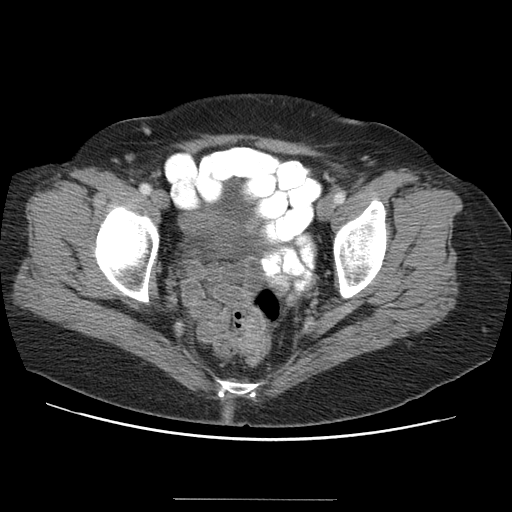
[im 19/86  soft-tissue]
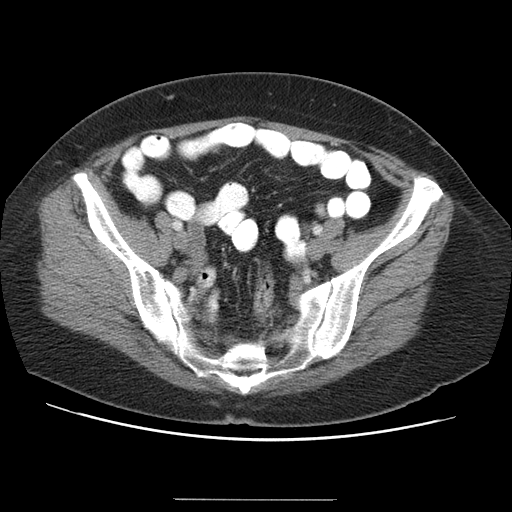
[im 24/86  soft-tissue]
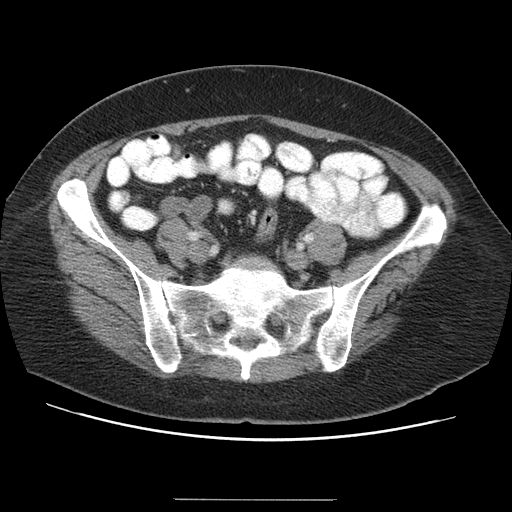
[im 29/86  soft-tissue]
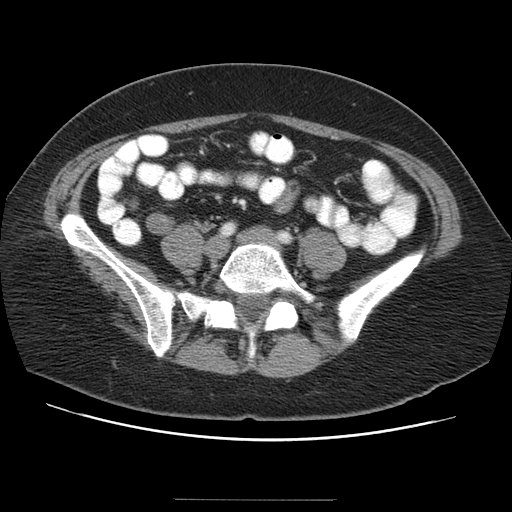
[im 38/86  soft-tissue]
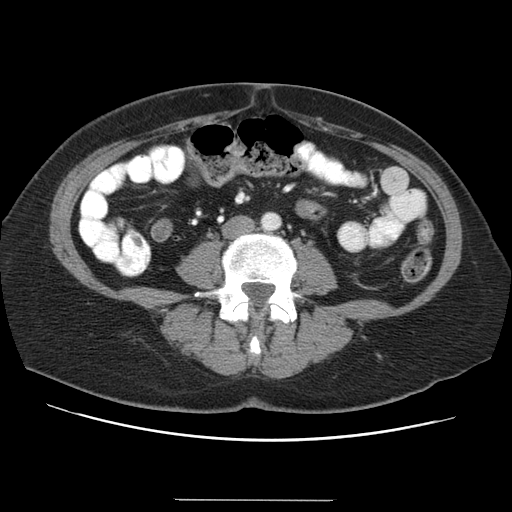
[im 43/86  soft-tissue]
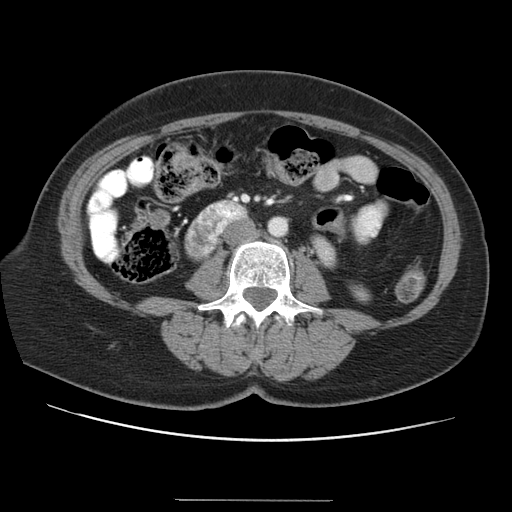
[im 48/86  soft-tissue]
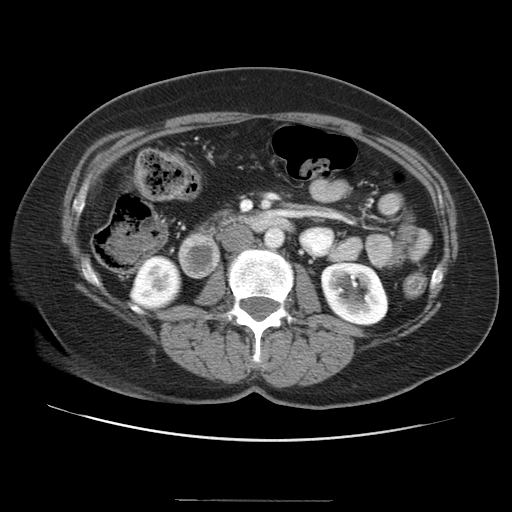
[im 57/86  soft-tissue]
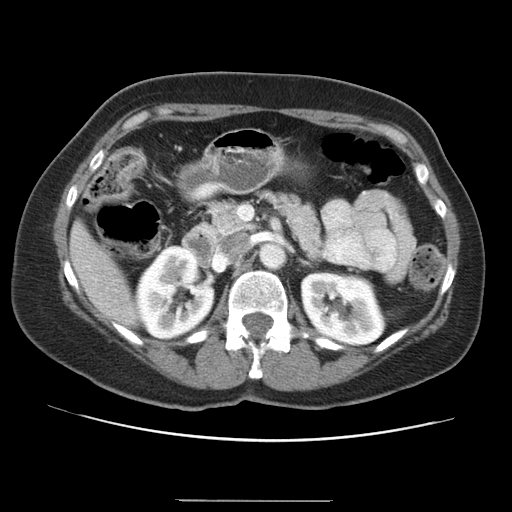
[im 57/86  bone]
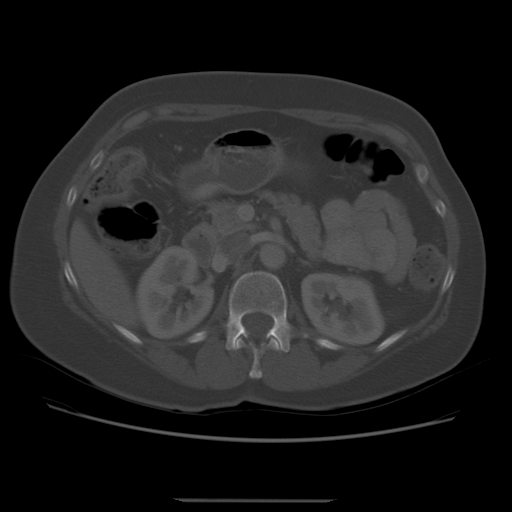
[im 62/86  soft-tissue]
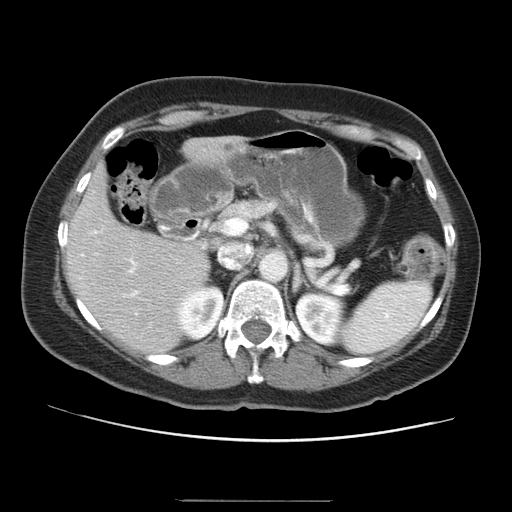
[im 67/86  soft-tissue]
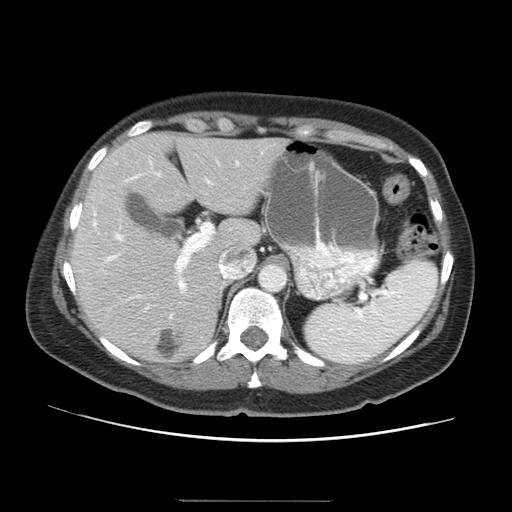
[im 67/86  lung]
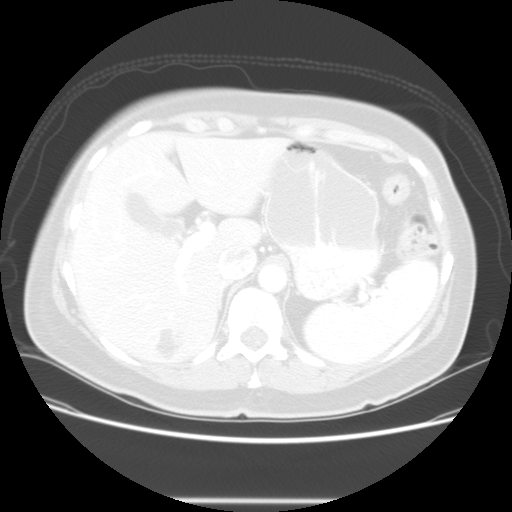
[im 71/86  lung]
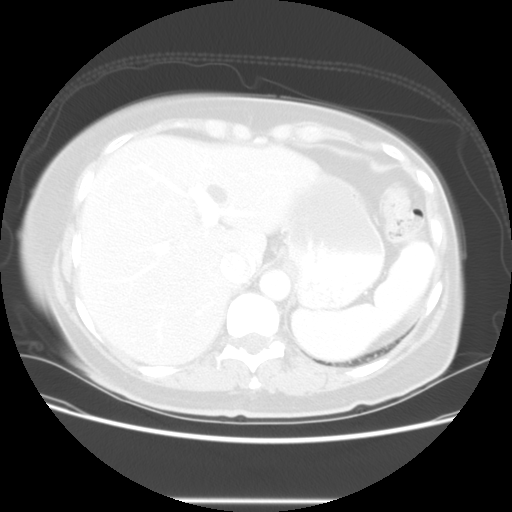
[im 76/86  soft-tissue]
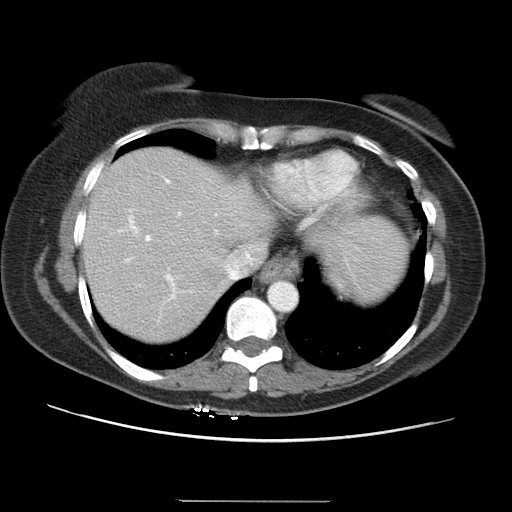
[im 76/86  lung]
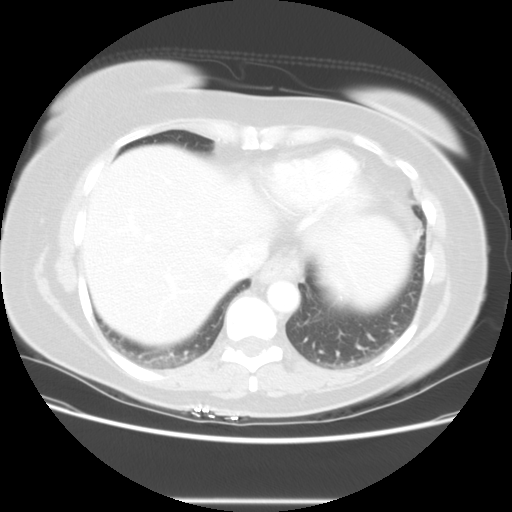
[im 81/86  soft-tissue]
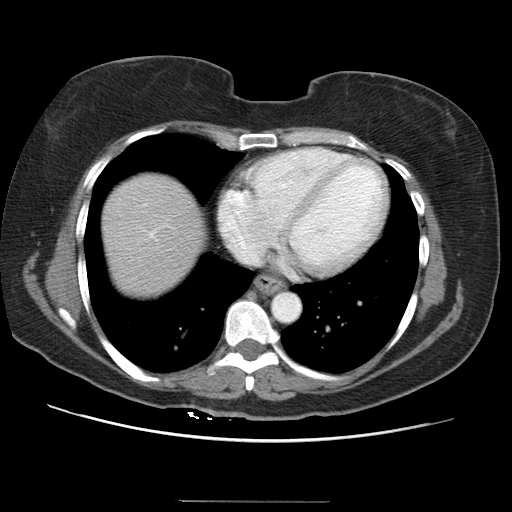
[im 81/86  lung]
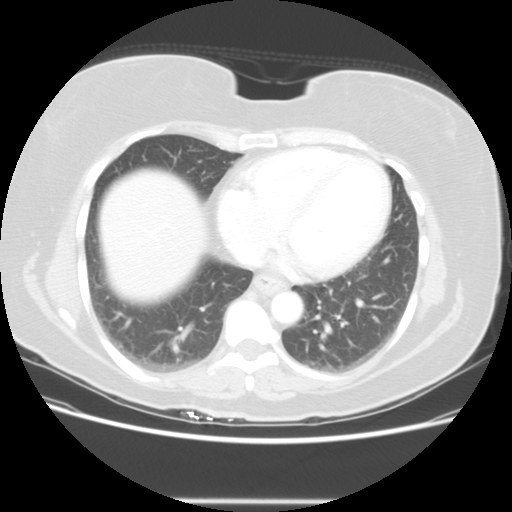

[14 of 36 positions shown; findings below may reference images not displayed]

FINDINGS: The lung bases are clear.  There are several well
circumscribed low attenuation structures scattered throughout both
the right and left lobes of liver most consistent with liver cysts.
No ductal dilatation is seen.  No calcified gallstones are noted.
The pancreas is normal in size and the pancreatic duct is not
dilated.  The adrenal glands and spleen are unremarkable with the
exception of a probable cyst or hemangioma peripherally in the
superior aspect of the spleen.  The stomach is moderately distended
with contrast and fluid with no abnormality noted.  The kidneys
enhance with no calculus or mass and no hydronephrosis is seen.  On
delayed images, the pelvocaliceal systems are unremarkable.  The
abdominal aorta is normal in caliber.  The origins of the
mesenteric vasculature are patent.  No adenopathy is seen.

The urinary bladder is moderately distended with no abnormality
noted.  The patient has previously undergone hysterectomy and
oophorectomy.  No pelvic mass or fluid is seen.  There are a few
scattered colonic diverticula present.  No definite abnormality of
the colon is seen with areas of spasm as well as retained feces.
The terminal ileum is unremarkable as is the appendix.  No small
bowel dilatation is seen.  No bony abnormality is seen.
IMPRESSION: 1.  No explanation for the patient's pain is seen.
2.  Multiple hepatic cysts.
3.  Scattered colonic diverticula.
4.  The appendix and terminal ileum are unremarkable.

## 2014-02-21 ENCOUNTER — Other Ambulatory Visit: Payer: Self-pay | Admitting: Internal Medicine

## 2014-02-21 NOTE — Telephone Encounter (Signed)
Requested Medications     Medication name:  Name from pharmacy:  ALPRAZolam Duanne Moron) 0.5 MG tablet  ALPRAZolam Oral Tablet 0.5 MG    Sig: take 1 tablet by mouth at bedtime 2 times weekly as directed    Dispense: 8 tablet (Pharmacy requested 8) Start: 02/21/2014  Class: Normal    Requested on: 06/28/2013    Originally ordered on: 04/14/2012 Last refill: 12/10/2013 Order History and Details

## 2014-02-21 NOTE — Telephone Encounter (Signed)
Ok to call in as ordered

## 2014-04-02 ENCOUNTER — Telehealth: Payer: Self-pay | Admitting: *Deleted

## 2014-04-02 NOTE — Telephone Encounter (Signed)
Spoke w/ [facility] & they have refill on 8 tabs on file.Called pt & l/m for her to r/t call to give info.

## 2014-04-02 NOTE — Telephone Encounter (Signed)
Pt called in requesting refill on 0.5 XANAX ,last office visit 08/14/14 .Looks like last refill was 7/2 w/ 1 refill but pt said that it had expired .May I call in ?

## 2014-04-02 NOTE — Telephone Encounter (Signed)
Kim  Call her pharmacy and ask when pt last refilled her Xanax and how many did she get?  Do they have a refill on her last RX from me??  Route back to me

## 2014-04-03 NOTE — Telephone Encounter (Signed)
Spoke with patient and let her know that [facility] has a refill on file for this medication.

## 2014-06-13 ENCOUNTER — Other Ambulatory Visit: Payer: Self-pay | Admitting: *Deleted

## 2014-06-13 NOTE — Telephone Encounter (Signed)
Refill request

## 2014-06-14 MED ORDER — ALPRAZOLAM 0.5 MG PO TABS: ORAL_TABLET | ORAL | Status: DC

## 2014-06-14 NOTE — Telephone Encounter (Signed)
RX called in -eh 

## 2014-06-14 NOTE — Telephone Encounter (Signed)
Ok to call in as ordered

## 2014-06-19 ENCOUNTER — Encounter: Payer: Self-pay | Admitting: Internal Medicine

## 2014-06-19 ENCOUNTER — Ambulatory Visit (INDEPENDENT_AMBULATORY_CARE_PROVIDER_SITE_OTHER): Payer: Managed Care, Other (non HMO) | Admitting: Internal Medicine

## 2014-06-19 ENCOUNTER — Telehealth: Payer: Self-pay

## 2014-06-19 VITALS — BP 124/76 | HR 72 | Temp 98.1°F | Resp 16 | Ht 63.25 in | Wt 182.0 lb

## 2014-06-19 DIAGNOSIS — G4700 Insomnia, unspecified: Secondary | ICD-10-CM

## 2014-06-19 DIAGNOSIS — F329 Major depressive disorder, single episode, unspecified: Secondary | ICD-10-CM

## 2014-06-19 DIAGNOSIS — F32A Depression, unspecified: Secondary | ICD-10-CM

## 2014-06-19 DIAGNOSIS — Z780 Asymptomatic menopausal state: Secondary | ICD-10-CM

## 2014-06-19 LAB — CBC WITH DIFFERENTIAL/PLATELET
Basophils Absolute: 0.1 10*3/uL (ref 0.0–0.1)
Basophils Relative: 1 % (ref 0–1)
Eosinophils Absolute: 0.2 10*3/uL (ref 0.0–0.7)
Eosinophils Relative: 3 % (ref 0–5)
HCT: 38 % (ref 36.0–46.0)
Hemoglobin: 13.2 g/dL (ref 12.0–15.0)
LYMPHS ABS: 2.4 10*3/uL (ref 0.7–4.0)
LYMPHS PCT: 36 % (ref 12–46)
MCH: 28.4 pg (ref 26.0–34.0)
MCHC: 34.7 g/dL (ref 30.0–36.0)
MCV: 81.7 fL (ref 78.0–100.0)
MONOS PCT: 6 % (ref 3–12)
Monocytes Absolute: 0.4 10*3/uL (ref 0.1–1.0)
NEUTROS ABS: 3.6 10*3/uL (ref 1.7–7.7)
Neutrophils Relative %: 54 % (ref 43–77)
PLATELETS: 260 10*3/uL (ref 150–400)
RBC: 4.65 MIL/uL (ref 3.87–5.11)
RDW: 14.4 % (ref 11.5–15.5)
WBC: 6.6 10*3/uL (ref 4.0–10.5)

## 2014-06-19 LAB — LIPID PANEL
Cholesterol: 191 mg/dL (ref 0–200)
HDL: 51 mg/dL (ref >39)
LDL CALC: 112 mg/dL — AB (ref 0–99)
Total CHOL/HDL Ratio: 3.7 Ratio
Triglycerides: 139 mg/dL (ref <150)
VLDL: 28 mg/dL (ref 0–40)

## 2014-06-19 LAB — COMPLETE METABOLIC PANEL WITH GFR
ALT: 15 U/L (ref 0–35)
AST: 17 U/L (ref 0–37)
Albumin: 4.3 g/dL (ref 3.5–5.2)
Alkaline Phosphatase: 72 U/L (ref 39–117)
BILIRUBIN TOTAL: 0.4 mg/dL (ref 0.2–1.2)
BUN: 8 mg/dL (ref 6–23)
CHLORIDE: 105 meq/L (ref 96–112)
CO2: 25 mEq/L (ref 19–32)
Calcium: 9.1 mg/dL (ref 8.4–10.5)
Creat: 0.64 mg/dL (ref 0.50–1.10)
GFR, Est African American: >89 mL/min
GFR, Est Non African American: >89 mL/min
GLUCOSE: 89 mg/dL (ref 70–99)
Potassium: 4.3 mEq/L (ref 3.5–5.3)
SODIUM: 141 meq/L (ref 135–145)
TOTAL PROTEIN: 6.9 g/dL (ref 6.0–8.3)

## 2014-06-19 LAB — TSH: TSH: 0.222 u[IU]/mL — AB (ref 0.350–4.500)

## 2014-06-19 MED ORDER — ALPRAZOLAM 0.5 MG PO TABS: ORAL_TABLET | ORAL | Status: DC

## 2014-06-19 NOTE — Telephone Encounter (Signed)
Adalin called to say that she talked with Dr Baldwin [facility] office and she had her last TDap on 02/13/2013

## 2014-06-19 NOTE — Telephone Encounter (Signed)
Documented in chart-eh

## 2014-06-19 NOTE — Progress Notes (Signed)
Subjective:    Patient ID: Sandra Gonzales, female    DOB: 1956-02-13, 58 y.o.   MRN: [id number]  HPI 07/2013   Health Maintenance: Had influenza. Will check with insurance regarding Zostavax. Will schedule 3 D mm with bone density  History of MNG Euthyroid biochemically. Goiter managed by her endocrinologist Dr. Forde Dandy I cannot palpate any discrete thyroid nodules today on exam  Minimal hyperlipidemia DASH diet given  Menopausal hot flushes. On Climara patch per her GYN. I discussed risk of DVT and that science is unclear if estrogen safe with a history of provoked DVT. She is factor V Leiden negative. Discussed evaluation by hematology but pt declines. She is to discuss length of time of continueation of Climara with her GYN.  History of basal cell skin CA  Minimal Vitamin D deficiency Advised OTVC vitamin D    Today  Here for follow up    She has been busy with grand-son who was 28 weeks and needs surgery.  Has been going back and forth to [place]   She has stopped using Climara    Has some insomnia with stress of grandson   Allergies  Allergen Reactions  . Sulfa Drugs Cross Reactors     hallucinations   Past Medical History  Diagnosis Date  . Allergy     hay fever  . Cancer     BCC on L lower eyelid, R nasal bridge &back  . GERD (gastroesophageal reflux disease)   . Overweight   . Constipation   . Benign thyroid cyst     previously drained, has not recurred  . Hyperlipidemia 11/25/2010  . Left ankle sprain 03/06/2012  . Diverticulosis of colon (without mention of hemorrhage) 2008  . Hx of colonic polyps 2008    Hyperplastic   . Multinodular goiter    Past Surgical History  Procedure Laterality Date  . Basal cell left lower eyelid  9/11    bcc removed from R nasal bridge and back also  . Torn miniscus left knee  1-09  . Thyroid cyst excision      aspirated  . Endometrial ablation  8 years ago    For clotting, no further bleeding  . Abdominal hysterectomy   01-22-11    still has ovaries   History   Social History  . Marital Status: Married    Spouse Name: N/A    Number of Children: 2  . Years of Education: N/A   Occupational History  . Assisted Living     Move in coordinator    Social History Main Topics  . Smoking status: Never Smoker   . Smokeless tobacco: Never Used  . Alcohol Use: No  . Drug Use: No  . Sexual Activity: Yes    Partners: Male   Other Topics Concern  . Not on file   Social History Narrative   Daily caffeine   Family History  Problem Relation Age of Onset  . Other Father     Congestive heart failure  . Diabetes Father   . Diabetes Brother     controlled w/ diet  . Cancer Brother     basal cell  . Diabetes Paternal Grandmother     leg amputation  . Leukemia Paternal Grandfather   . Colon cancer Neg Hx   . Stomach cancer Neg Hx   . Lung cancer Mother    Patient Active Problem List   Diagnosis Date Noted  . H/O bone density study 09/09/2013  .  Multinodular goiter 08/19/2013  . Personal history of DVT (deep vein thrombosis) 06/28/2013  . Anxiety 06/28/2013  . DJD (degenerative joint disease) 06/28/2013  . Menopausal hot flushes 06/28/2013  . Endometriosis 06/28/2013  . S/P abdominal hysterectomy and left salpingo-oophorectomy 06/28/2013  . Left ankle sprain 03/06/2012  . Hyperlipidemia 11/25/2010  . Seasonal allergies   . Cancer   . GERD (gastroesophageal reflux disease)   . Constipation   . Benign thyroid cyst    Current Outpatient Prescriptions on File Prior to Visit  Medication Sig Dispense Refill  . ALPRAZolam (XANAX) 0.5 MG tablet take 1 tablet by mouth at bedtime 2 times weekly as directed  8 tablet  1  . amoxicillin-clavulanate (AUGMENTIN) 875-125 MG per tablet Take 1 tablet by mouth 2 (two) times daily.  14 tablet  0  . estradiol (CLIMARA - DOSED IN MG/24 HR) 0.025 mg/24hr Place 1 patch onto the skin once a week.      . Ibuprofen-Diphenhydramine Cit (ADVIL PM PO) Take 1 tablet by  mouth at bedtime.      . NON FORMULARY Stool softner w/ mild laxative- qd       . omeprazole (PRILOSEC) 40 MG capsule Take 1 capsule (40 mg total) by mouth daily.  30 capsule  11  . OVER THE COUNTER MEDICATION 1 capsule by Per post-pyloric tube route daily. Stool softner      . Phenyleph-Promethazine-Cod (PROMETHAZINE VC/CODEINE) 5-6.25-10 MG/5ML SYRP Take 5 mLs by mouth every 4 (four) hours as needed.  120 mL  0   No current facility-administered medications on file prior to visit.       Review of Systems See HPI    Objective:   Physical Exam  Physical Exam  Nursing note and vitals reviewed.  Constitutional: She is oriented to person, place, and time. She appears well-developed and well-nourished.  HENT:  Head: Normocephalic and atraumatic.  Cardiovascular: Normal rate and regular rhythm. Exam reveals no gallop and no friction rub.  No murmur heard.  Pulmonary/Chest: Breath sounds normal. She has no wheezes. She has no rales.  Neurological: She is alert and oriented to person, place, and time.  Skin: Skin is warm and dry.  Psychiatric: She has a normal mood and affect. Her behavior is normal.        Assessment & Plan:  Insomnia  Ok for Xanax 2-3 nights per week  Hyperlipidemia:  Will check today   MNG  Managed by Dr. Forde Dandy   Schedule CPE

## 2014-06-20 ENCOUNTER — Ambulatory Visit: Payer: Managed Care, Other (non HMO) | Admitting: Internal Medicine

## 2014-06-20 LAB — VITAMIN D 25 HYDROXY (VIT D DEFICIENCY, FRACTURES): Vit D, 25-Hydroxy: 26 ng/mL — ABNORMAL LOW (ref 30–89)

## 2014-06-24 ENCOUNTER — Telehealth: Payer: Self-pay | Admitting: Internal Medicine

## 2014-06-24 ENCOUNTER — Encounter: Payer: Self-pay | Admitting: Internal Medicine

## 2014-06-24 NOTE — Telephone Encounter (Signed)
Left message with pt to inform of lab TSH

## 2014-06-24 NOTE — Telephone Encounter (Signed)
Spoke with pt about abnormal thyroid tests  She does see Dr. Forde Dandy who treats her thryoid  And pt wishes to see him in office about this   Will fax labs to him  Pt will make her own appt

## 2014-06-26 ENCOUNTER — Telehealth: Payer: Self-pay

## 2014-06-26 NOTE — Telephone Encounter (Signed)
Sandra Gonzales [phone]  [name] would like for someone to call her back and talk to her about her level, she was not sure if hers was low or high and what a normal thyroid level is.

## 2014-06-28 NOTE — Telephone Encounter (Signed)
I spoke with Tanja in regards to her thyroid-eh

## 2014-07-08 ENCOUNTER — Telehealth: Payer: Self-pay | Admitting: Internal Medicine

## 2014-07-08 ENCOUNTER — Telehealth: Payer: Self-pay

## 2014-07-08 NOTE — Telephone Encounter (Addendum)
Sandra Gonzales [phone]  Langley Gauss returned call - She did state that she is in [place] with daughter premature baby is having surgery tomorrow so she will try and listen for call. Jolita also stated that she had gone and seen Dr Forde Dandy her endocarnoligist and he said that even thru the TSH was low and the T3 and T4 normal he will check her again in 3 months.

## 2014-07-08 NOTE — Telephone Encounter (Signed)
Left message for pt to call office regarding thyroid blood tests

## 2014-07-08 NOTE — Telephone Encounter (Signed)
Thanks Mariann Laster    Call pt back and let her know that I just wanted to know if she spoke with Dr. Forde Dandy and her thyroid was taken care of  [place] to mail labs to her

## 2014-07-16 ENCOUNTER — Other Ambulatory Visit (HOSPITAL_COMMUNITY): Payer: Self-pay | Admitting: Endocrinology

## 2014-07-16 DIAGNOSIS — E042 Nontoxic multinodular goiter: Secondary | ICD-10-CM

## 2014-07-30 ENCOUNTER — Encounter (HOSPITAL_COMMUNITY)
Admission: RE | Admit: 2014-07-30 | Discharge: 2014-07-30 | Disposition: A | Discharge status: Home / Self Care | Payer: Managed Care, Other (non HMO) | Source: Ambulatory Visit | Attending: Endocrinology | Admitting: Endocrinology

## 2014-07-30 DIAGNOSIS — E042 Nontoxic multinodular goiter: Secondary | ICD-10-CM | POA: Insufficient documentation

## 2014-07-31 ENCOUNTER — Encounter (HOSPITAL_COMMUNITY): Payer: Managed Care, Other (non HMO)

## 2014-08-01 ENCOUNTER — Ambulatory Visit (HOSPITAL_COMMUNITY): Payer: Managed Care, Other (non HMO)

## 2014-08-02 ENCOUNTER — Other Ambulatory Visit (HOSPITAL_COMMUNITY): Payer: Managed Care, Other (non HMO)

## 2014-08-13 ENCOUNTER — Encounter (HOSPITAL_COMMUNITY)
Admission: RE | Admit: 2014-08-13 | Discharge: 2014-08-13 | Disposition: A | Discharge status: Home / Self Care | Payer: Managed Care, Other (non HMO) | Source: Ambulatory Visit | Attending: Endocrinology | Admitting: Endocrinology

## 2014-08-13 DIAGNOSIS — E042 Nontoxic multinodular goiter: Secondary | ICD-10-CM | POA: Insufficient documentation

## 2014-08-13 MED ORDER — SODIUM IODIDE I 131 CAPSULE
11.9000 | Freq: Once | INTRAVENOUS | Status: AC | PRN
  Administered 2014-08-13: 11.9 via ORAL

## 2014-08-14 ENCOUNTER — Encounter (HOSPITAL_COMMUNITY)
Admission: RE | Admit: 2014-08-14 | Discharge: 2014-08-14 | Disposition: A | Discharge status: Home / Self Care | Payer: Managed Care, Other (non HMO) | Source: Ambulatory Visit | Attending: Endocrinology | Admitting: Endocrinology

## 2014-08-14 ENCOUNTER — Other Ambulatory Visit: Payer: Self-pay | Admitting: Internal Medicine

## 2014-08-14 DIAGNOSIS — E042 Nontoxic multinodular goiter: Secondary | ICD-10-CM | POA: Diagnosis not present

## 2014-08-14 MED ORDER — SODIUM PERTECHNETATE TC 99M INJECTION
10.0000 | Freq: Once | INTRAVENOUS | Status: AC | PRN
  Administered 2014-08-14: 10 via INTRAVENOUS

## 2014-11-19 ENCOUNTER — Ambulatory Visit (INDEPENDENT_AMBULATORY_CARE_PROVIDER_SITE_OTHER): Payer: PRIVATE HEALTH INSURANCE | Admitting: Internal Medicine

## 2014-11-19 ENCOUNTER — Encounter: Payer: Self-pay | Admitting: Internal Medicine

## 2014-11-19 VITALS — BP 116/69 | HR 78 | Resp 16 | Ht 63.0 in | Wt 179.0 lb

## 2014-11-19 DIAGNOSIS — E042 Nontoxic multinodular goiter: Secondary | ICD-10-CM | POA: Diagnosis not present

## 2014-11-19 DIAGNOSIS — Z1211 Encounter for screening for malignant neoplasm of colon: Secondary | ICD-10-CM

## 2014-11-19 DIAGNOSIS — Z0000 Encounter for general adult medical examination without abnormal findings: Secondary | ICD-10-CM

## 2014-11-19 DIAGNOSIS — E785 Hyperlipidemia, unspecified: Secondary | ICD-10-CM | POA: Diagnosis not present

## 2014-11-19 DIAGNOSIS — G4700 Insomnia, unspecified: Secondary | ICD-10-CM | POA: Diagnosis not present

## 2014-11-19 DIAGNOSIS — K219 Gastro-esophageal reflux disease without esophagitis: Secondary | ICD-10-CM

## 2014-11-19 LAB — POCT URINALYSIS DIPSTICK
Bilirubin, UA: NEGATIVE
Blood, UA: NEGATIVE
Glucose, UA: NEGATIVE
Ketones, UA: NEGATIVE
LEUKOCYTES UA: NEGATIVE
Nitrite, UA: NEGATIVE
PROTEIN UA: NEGATIVE
SPEC GRAV UA: 1.01
UROBILINOGEN UA: NEGATIVE
pH, UA: 6.5

## 2014-11-19 LAB — FECAL OCCULT BLOOD, GUAIAC: Fecal Occult Blood: NEGATIVE

## 2014-11-19 LAB — HEMOCCULT GUIAC POC 1CARD (OFFICE): Fecal Occult Blood, POC: NEGATIVE

## 2014-11-19 MED ORDER — ALPRAZOLAM 0.5 MG PO TABS: ORAL_TABLET | ORAL | Status: DC

## 2014-11-19 NOTE — Progress Notes (Signed)
Subjective:    Patient ID: Sandra Gonzales, female    DOB: 1956-03-22, 59 y.o.   MRN: [id number]  HPI 06/19/2014 Assessment & Plan:  Insomnia Ok for Xanax 2-3 nights per week  Hyperlipidemia: Will check today   MNG Managed by Dr. Forde Dandy   Schedule CPE       12/20143 CPE   Assessment & Plan:  Health Maintenance: Had influenza. Will check with insurance regarding Zostavax. Will schedule 3 D mm with bone density   History of MNG Euthyroid biochemically. Goiter managed by her endocrinologist Dr. Forde Dandy I cannot palpate any discrete thyroid nodules today on exam  Minimal hyperlipidemia DASH diet given  Menopausal hot flushes. On Climara patch per her GYN. I discussed risk of DVT and that science is unclear if estrogen safe with a history of provoked DVT. She is factor V Leiden negative. Discussed evaluation by hematology but pt declines. She is to discuss length of time of continueation of Climara with her GYN.  History of basal cell skin CA  Minimal Vitamin D deficiency Advised OTVC vitamin D         Bergen is here for cpe  HM: S/P hyst  colonscopy 2013,  Mm due pt states scheduled for later this week.  She is a non-smoler   Problems list reviewed   Allergies  Allergen Reactions  . Sulfa Drugs Cross Reactors     hallucinations   Past Medical History  Diagnosis Date  . Allergy     hay fever  . Cancer     BCC on L lower eyelid, R nasal bridge &back  . GERD (gastroesophageal reflux disease)   . Overweight(278.02)   . Constipation   . Benign thyroid cyst     previously drained, has not recurred  . Hyperlipidemia 11/25/2010  . Left ankle sprain 03/06/2012  . Diverticulosis of colon (without mention of hemorrhage) 2008  . Hx of colonic polyps 2008    Hyperplastic   . Multinodular goiter    Past Surgical History  Procedure Laterality Date  . Basal cell left lower eyelid  9/11    bcc removed from R nasal bridge and back also  . Torn  miniscus left knee  1-09  . Thyroid cyst excision      aspirated  . Endometrial ablation  8 years ago    For clotting, no further bleeding  . Abdominal hysterectomy  01-22-11    still has ovaries   History   Social History  . Marital Status: Married    Spouse Name: N/A  . Number of Children: 2  . Years of Education: N/A   Occupational History  . Assisted Living     Move in coordinator    Social History Main Topics  . Smoking status: Never Smoker   . Smokeless tobacco: Never Used  . Alcohol Use: No  . Drug Use: No  . Sexual Activity:    Partners: Male   Other Topics Concern  . Not on file   Social History Narrative   Daily caffeine   Family History  Problem Relation Age of Onset  . Other Father     Congestive heart failure  . Diabetes Father   . Diabetes Brother     controlled w/ diet  . Cancer Brother     basal cell  . Diabetes Paternal Grandmother     leg amputation  . Leukemia Paternal Grandfather   . Colon cancer Neg Hx   . Stomach cancer Neg  Hx   . Lung cancer Mother    Patient Active Problem List   Diagnosis Date Noted  . H/O bone density study 09/09/2013  . Multinodular goiter 08/19/2013  . Personal history of DVT (deep vein thrombosis) 06/28/2013  . Anxiety 06/28/2013  . DJD (degenerative joint disease) 06/28/2013  . Menopausal hot flushes 06/28/2013  . Endometriosis 06/28/2013  . S/P abdominal hysterectomy and left salpingo-oophorectomy 06/28/2013  . Left ankle sprain 03/06/2012  . Hyperlipidemia 11/25/2010  . Seasonal allergies   . Cancer   . GERD (gastroesophageal reflux disease)   . Constipation   . Benign thyroid cyst    Current Outpatient Prescriptions on File Prior to Visit  Medication Sig Dispense Refill  . ALPRAZolam (XANAX) 0.5 MG tablet take 1 tablet by mouth at bedtime 2 times weekly as directed 20 tablet 1  . Ibuprofen-Diphenhydramine Cit (ADVIL PM PO) Take 1 tablet by mouth at bedtime.    Earney Navy Bicarbonate  (ZEGERID OTC PO) Take 1 tablet by mouth daily.     No current facility-administered medications on file prior to visit.      Review of Systems     Objective:   Physical Exam Physical Exam  Nursing note and vitals reviewed.  Constitutional: She is oriented to person, place, and time. She appears well-developed and well-nourished.  HENT:  Head: Normocephalic and atraumatic.  Right Ear: Tympanic membrane and ear canal normal. No drainage. Tympanic membrane is not injected and not erythematous.  Left Ear: Tympanic membrane and ear canal normal. No drainage. Tympanic membrane is not injected and not erythematous.  Nose: Nose normal. Right sinus exhibits no maxillary sinus tenderness and no frontal sinus tenderness. Left sinus exhibits no maxillary sinus tenderness and no frontal sinus tenderness.  Mouth/Throat: Oropharynx is clear and moist. No oral lesions. No oropharyngeal exudate.  Eyes: Conjunctivae and EOM are normal. Pupils are equal, round, and reactive to light.  Neck: Normal range of motion. Neck supple. No JVD present. Carotid bruit is not present. No mass and no thyromegaly present.  Cardiovascular: Normal rate, regular rhythm, S1 normal, S2 normal and intact distal pulses. Exam reveals no gallop and no friction rub.  No murmur heard.  Pulses:  Carotid pulses are 2+ on the right side, and 2+ on the left side.  Dorsalis pedis pulses are 2+ on the right side, and 2+ on the left side.  No carotid bruit. No LE edema  Pulmonary/Chest: Breath sounds normal. She has no wheezes. She has no rales. She exhibits no tenderness.  Breast no discrete mass no nipple discharge no axillary adenopathy bilaterally Abdominal: Soft. Bowel sounds are normal. She exhibits no distension and no mass. There is no hepatosplenomegaly. There is no tenderness. There is no CVA tenderness.  Rectal no mass guaiac neg Musculoskeletal: Normal range of motion.  No active synovitis to joints.  Lymphadenopathy:    She has no cervical adenopathy.  She has no axillary adenopathy.  Right: No inguinal and no supraclavicular adenopathy present.  Left: No inguinal and no supraclavicular adenopathy present.  Neurological: She is alert and oriented to person, place, and time. She has normal strength and normal reflexes. She displays no tremor. No cranial nerve deficit or sensory deficit. Coordination and gait normal.  Skin: Skin is warm and dry. No rash noted. No cyanosis. Nails show no clubbing.  Psychiatric: She has a normal mood and affect. Her speech is normal and behavior is normal. Cognition and memory are normal.  Assessment & Plan:  HM  Advised mm later this week she is a non-smoker see scanned sheet  Hyperlipidemia  :  Advised DASH diet   MG  Managed by Dr. Forde Dandy  GERD continue Prilosec  Basal cell skin CA  Managed by dermatology    Insomnia  Rare Xanax use ok to refill

## 2014-11-20 ENCOUNTER — Encounter: Payer: Self-pay | Admitting: *Deleted

## 2014-11-20 ENCOUNTER — Encounter: Payer: Managed Care, Other (non HMO) | Admitting: Internal Medicine

## 2014-11-26 ENCOUNTER — Encounter: Payer: Self-pay | Admitting: *Deleted

## 2016-10-11 LAB — CBC AND DIFFERENTIAL
HCT: 41 (ref 36–46)
Hemoglobin: 13.6 (ref 12.0–16.0)
Platelets: 219 (ref 150–399)
WBC: 6.9

## 2016-10-11 LAB — FECAL OCCULT BLOOD, GUAIAC: Fecal Occult Blood: NEGATIVE

## 2016-10-11 LAB — LIPID PANEL
Cholesterol: 193 (ref 0–200)
HDL: 54 (ref 35–70)
LDL Cholesterol: 105
Triglycerides: 171 — AB (ref 40–160)

## 2016-10-11 LAB — BASIC METABOLIC PANEL
BUN: 12 (ref 4–21)
Creatinine: 0.7 (ref 0.5–1.1)
GLUCOSE: 79
POTASSIUM: 4.4 (ref 3.4–5.3)
Sodium: 141 (ref 137–147)

## 2016-10-11 LAB — TSH: TSH: 0.15 — AB (ref 0.41–5.90)

## 2016-10-11 LAB — HEPATIC FUNCTION PANEL
ALT: 28 (ref 7–35)
AST: 21 (ref 13–35)
Alkaline Phosphatase: 73 (ref 25–125)
Bilirubin, Total: 0.4

## 2016-10-11 LAB — VITAMIN D 25 HYDROXY (VIT D DEFICIENCY, FRACTURES): Vit D, 25-Hydroxy: 18.6

## 2016-10-29 LAB — HM MAMMOGRAPHY

## 2017-02-08 ENCOUNTER — Encounter: Payer: Self-pay | Admitting: Physician Assistant

## 2017-02-08 ENCOUNTER — Ambulatory Visit (INDEPENDENT_AMBULATORY_CARE_PROVIDER_SITE_OTHER): Payer: PRIVATE HEALTH INSURANCE

## 2017-02-08 ENCOUNTER — Ambulatory Visit (INDEPENDENT_AMBULATORY_CARE_PROVIDER_SITE_OTHER): Payer: PRIVATE HEALTH INSURANCE | Admitting: Physician Assistant

## 2017-02-08 VITALS — BP 128/80 | HR 78 | Ht 62.6 in | Wt 170.4 lb

## 2017-02-08 DIAGNOSIS — S61214A Laceration without foreign body of right ring finger without damage to nail, initial encounter: Secondary | ICD-10-CM

## 2017-02-08 MED ORDER — TRAMADOL HCL 50 MG PO TABS: 50.0000 mg | ORAL_TABLET | Freq: Two times a day (BID) | ORAL | 0 refills | Status: DC | PRN

## 2017-02-08 NOTE — Progress Notes (Deleted)
Sandra Gonzales is a 61 y.o. female here for a {New prob or follow up:31724}.  SCRIBE STATEMENT  History of Present Illness:   Chief Complaint  Patient presents with  . Laceration    right hand ring finger  . Establish Care    Acute Concerns:   Chronic Issues:   Health Maintenance: Immunizations -- *** Colonoscopy -- *** Mammogram -- *** PAP -- *** Bone Density -- *** PSA -- *** Diet -- *** Caffeine intake -- *** Sleep habits -- *** Exercise -- *** Weight --    Mood -- ***  Depression screen PHQ 2/9 06/28/2013  Decreased Interest 0  Down, Depressed, Hopeless 0  PHQ - 2 Score 0    Other providers/specialists:    PMHx, SurgHx, SocialHx, Medications, and Allergies were reviewed in the Visit Navigator and updated as appropriate.  Current Medications:   Current Outpatient Prescriptions:  .  ALPRAZolam (XANAX) 0.5 MG tablet, take 1 tablet by mouth at bedtime 2 times weekly as directed, Disp: 20 tablet, Rfl: 1 .  cetirizine (ZYRTEC) 10 MG tablet, Take 10 mg by mouth daily., Disp: , Rfl:  .  Docusate Calcium (STOOL SOFTENER PO), Take by mouth., Disp: , Rfl:  .  Ibuprofen-Diphenhydramine Cit (ADVIL PM PO), Take 1 tablet by mouth at bedtime., Disp: , Rfl:  .  Omeprazole-Sodium Bicarbonate (ZEGERID OTC PO), Take 1 tablet by mouth daily., Disp: , Rfl:    Review of Systems:   ROS  Vitals:   Vitals:   02/08/17 1323  Pulse: 78  SpO2: 98%     There is no height or weight on file to calculate BMI.  Physical Exam:   Physical Exam  Assessment and Plan:    There are no diagnoses linked to this encounter.  . Reviewed expectations re: course of current medical issues. . Discussed self-management of symptoms. . Outlined signs and symptoms indicating need for more acute intervention. . Patient verbalized understanding and all questions were answered. . See orders for this visit as documented in the electronic medical record. . Patient received an After-Visit  Summary.  CMA or LPN served as scribe during this visit. History, Physical, and Plan performed by medical provider. Documentation and orders reviewed and attested to.  Inda Coke, PA-C

## 2017-02-08 NOTE — Progress Notes (Signed)
Sandra Gonzales is a 61 y.o. female here for laceration right tip ring finger.  I acted as a Education administrator for [employer], PA-C Anselmo Pickler, LPN  History of Present Illness:   Chief Complaint  Patient presents with  . Laceration    right hand ring finger  . Establish Care    Laceration   The incident occurred less than 1 hour ago. The laceration is located on the right hand (ring finger). Size: <1 cm. The laceration mechanism was a broken glass (vase). The pain is at a severity of 8/10. The pain is moderate. The pain has been constant since onset. It is unknown if a foreign body is present. Her tetanus status is UTD.   Denies prior damage to this finger. She is not on any blood thinners. She denies excessive blood loss. She did state that she had some broken pieces of glass in her other fingers on her R hand however she did not sustain any additional lacerations. She took 3 ibuprofen prior to arrival.  PMHx, SurgHx, SocialHx, Medications, and Allergies were reviewed in the Visit Navigator and updated as appropriate.  Current Medications:   Current Outpatient Prescriptions:  .  ALPRAZolam (XANAX) 0.5 MG tablet, take 1 tablet by mouth at bedtime 2 times weekly as directed, Disp: 20 tablet, Rfl: 1 .  cetirizine (ZYRTEC) 10 MG tablet, Take 10 mg by mouth daily., Disp: , Rfl:  .  cholecalciferol (VITAMIN D) 1000 units tablet, Take 2,000 Units by mouth daily., Disp: , Rfl:  .  Ibuprofen-Diphenhydramine Cit (ADVIL PM PO), Take 1 tablet by mouth at bedtime., Disp: , Rfl:  .  Omeprazole-Sodium Bicarbonate (ZEGERID OTC PO), Take 1 tablet by mouth daily., Disp: , Rfl:  .  Docusate Calcium (STOOL SOFTENER PO), Take by mouth., Disp: , Rfl:  .  traMADol (ULTRAM) 50 MG tablet, Take 1 tablet (50 mg total) by mouth every 12 (twelve) hours as needed., Disp: 10 tablet, Rfl: 0   Review of Systems:   Review of Systems  Constitutional: Negative for chills, fever, malaise/fatigue and weight loss.   Musculoskeletal:       Pain at R ring finger tip   Skin:       Laceration   Neurological: Negative for dizziness, tingling, sensory change and loss of consciousness.  Endo/Heme/Allergies: Does not bruise/bleed easily.    Vitals:   Vitals:   02/08/17 1323  BP: 128/80  Pulse: 78  SpO2: 98%  Weight: 170 lb 6.4 oz (77.3 kg)  Height: 5' 2.6" (1.59 m)     Body mass index is 30.57 kg/m.  Physical Exam:   Physical Exam  Constitutional: She appears well-developed. She is cooperative.  Non-toxic appearance. She does not have a sickly appearance. She does not appear ill. No distress.  Cardiovascular: Normal rate, regular rhythm, S1 normal, S2 normal, normal heart sounds and normal pulses.   No LE edema  Pulmonary/Chest: Effort normal and breath sounds normal.  Neurological: She is alert. No sensory deficit.  Skin:  1/2 cm U-shaped superficial avulsion to tip of R ring finger, no visible bone, scant blood present after cleaning, no evidence of purulent discharge, no odor present, no damage to nail  Nursing note and vitals reviewed.   Area was gently irrigated with saline. A time-out was performed and after patient identification was verified, dermabond was applied to laceration. Area was bandaged after glue dried.  CLINICAL DATA:  Distal fourth digit laceration, initial Ing  EXAM: RIGHT RING FINGER 2+V  COMPARISON:  None.  FINDINGS: There is no evidence of fracture or dislocation. There is no evidence of arthropathy or other focal bone abnormality. Soft tissues are unremarkable. No radiopaque foreign body is noted.  IMPRESSION: No acute abnormality seen  Assessment and Plan:    Luis was seen today for laceration and establish care.  Diagnoses and all orders for this visit:  Laceration of right ring finger without damage to nail, foreign body presence unspecified, initial encounter -     DG Finger Ring Right  Other orders -     traMADol (ULTRAM) 50 MG tablet;  Take 1 tablet (50 mg total) by mouth every 12 (twelve) hours as needed.   X-ray obtained to assess for foreign body, no acute abnormality seen. Preliminary review of xray with Dr. Teresa Coombs does not show any evidence of foreign body or other acute concerns. Dermabond applied to area with satisfactory laceration closure. Area bandaged. Handout provided with skin glue instructions and precautions. Also discussed and provided handout of warning signs and red flags for return precautions, including numbness/tingling/increased blood loss/signs of infection or necrosis. Advised patient to contact us if any concerns.  . Reviewed expectations re: course of current medical issues. . Discussed self-management of symptoms. . Outlined signs and symptoms indicating need for more acute intervention. . Patient verbalized understanding and all questions were answered. . See orders for this visit as documented in the electronic medical record. . Patient received an After-Visit Summary.  CMA or LPN served as scribe during this visit. History, Physical, and Plan performed by medical provider. Documentation and orders reviewed and attested to.  Inda Coke, PA-C

## 2017-02-08 NOTE — Patient Instructions (Signed)

## 2017-02-10 ENCOUNTER — Telehealth: Payer: Self-pay | Admitting: Physician Assistant

## 2017-02-10 ENCOUNTER — Encounter: Payer: Self-pay | Admitting: Physician Assistant

## 2017-02-10 NOTE — Telephone Encounter (Signed)
ROI fax to [facility] @ [facility]

## 2017-02-11 ENCOUNTER — Encounter: Payer: Self-pay | Admitting: Physician Assistant

## 2017-02-14 ENCOUNTER — Ambulatory Visit (INDEPENDENT_AMBULATORY_CARE_PROVIDER_SITE_OTHER): Payer: PRIVATE HEALTH INSURANCE | Admitting: Physician Assistant

## 2017-02-14 ENCOUNTER — Encounter: Payer: Self-pay | Admitting: Physician Assistant

## 2017-02-14 ENCOUNTER — Telehealth: Payer: Self-pay | Admitting: Physician Assistant

## 2017-02-14 VITALS — BP 120/80 | HR 70 | Temp 98.3°F | Ht 62.5 in | Wt 171.0 lb

## 2017-02-14 DIAGNOSIS — S61214D Laceration without foreign body of right ring finger without damage to nail, subsequent encounter: Secondary | ICD-10-CM | POA: Diagnosis not present

## 2017-02-14 NOTE — Progress Notes (Signed)
Sandra Gonzales is a 61 y.o. female here follow up on right ring finger laceration.  I acted as a Education administrator for [employer], PA-C Sandra Pickler, LPN  History of Present Illness:   Chief Complaint  Patient presents with  . finger laceration    follow-up    Laceration   The incident occurred 5 to 7 days ago (pt has kept finger dry, finger tip is slightly red and dry blood present and looks swollen.). Pain location: Right ring finger. The laceration is 1 cm in size. The laceration mechanism was a broken glass. The pain is at a severity of 0/10. The patient is experiencing no pain. She reports no foreign bodies present.   Patient reports that since she last saw me she has not gotten a singleton water on her finger. She has tried blood along therapy finger. She has had no discharge and has had no pain since visiting me. She has been putting some topical bacitracin cream on it. She is going to the beach in a week and a half and wants to make sure that looks okay prior to going.   Past Medical History:  Diagnosis Date  . Allergy    hay fever  . Anxiety   . Benign thyroid cyst    previously drained, has not recurred  . Cancer ([place])    BCC on L lower eyelid, R nasal bridge &back  . Constipation   . Diverticulosis of colon (without mention of hemorrhage) 2008  . DVT (deep venous thrombosis) ([place])   . Endometriosis   . GERD (gastroesophageal reflux disease)   . Hepatic cyst   . Hx of colonic polyps 2008   Hyperplastic   . Hyperlipidemia 11/25/2010  . Left ankle sprain 03/06/2012  . Multinodular goiter   . Overweight(278.02)   . Vitamin D deficiency      Social History   Social History  . Marital status: Married    Spouse name: N/A  . Number of children: 2  . Years of education: N/A   Occupational History  . 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 Smoking status: Never Smoker  . Smokeless tobacco: Never  Used  . Alcohol use No  . Drug use: No  . Sexual activity: Yes    Partners: Male   Other Topics Concern  . Not on file   Social History Narrative   Daily caffeine    Past Surgical History:  Procedure Laterality Date  . ABDOMINAL HYSTERECTOMY  01-22-11   still has ovaries  . basal cell left lower eyelid  9/11   bcc removed from R nasal bridge and back also  . ENDOMETRIAL ABLATION  8 years ago   For clotting, no further bleeding  . THYROID CYST EXCISION     aspirated  . torn miniscus left knee  1-09    Family History  Problem Relation Age of Onset  . Other Father        Congestive heart failure  . Diabetes Father   . Diabetes Brother        controlled w/ diet  . Cancer Brother        basal cell  . Diabetes Paternal Grandmother        leg amputation  . Leukemia Paternal Grandfather   . Lung cancer Mother   . Colon cancer Neg Hx   . Stomach cancer Neg Hx  Allergies  Allergen Reactions  . Sulfa Drugs Cross Reactors     hallucinations    Current Medications:   Current Outpatient Prescriptions:  .  ALPRAZolam (XANAX) 0.5 MG tablet, take 1 tablet by mouth at bedtime 2 times weekly as directed, Disp: 20 tablet, Rfl: 1 .  cetirizine (ZYRTEC) 10 MG tablet, Take 10 mg by mouth daily., Disp: , Rfl:  .  cholecalciferol (VITAMIN D) 1000 units tablet, Take 2,000 Units by mouth daily., Disp: , Rfl:  .  Docusate Calcium (STOOL SOFTENER PO), Take by mouth., Disp: , Rfl:  .  Ibuprofen-Diphenhydramine Cit (ADVIL PM PO), Take 1 tablet by mouth at bedtime., Disp: , Rfl:  .  Omeprazole-Sodium Bicarbonate (ZEGERID OTC PO), Take 1 tablet by mouth daily., Disp: , Rfl:    Review of Systems:   Review of Systems  Constitutional: Negative for chills, fever, malaise/fatigue and weight loss.  Eyes: Negative for blurred vision and double vision.  Cardiovascular: Negative for chest pain and palpitations.  Skin: Negative for itching and rash.  Neurological: Negative for dizziness,  tingling, tremors, sensory change and headaches.  Endo/Heme/Allergies: Does not bruise/bleed easily.     Vitals:   Vitals:   02/14/17 1511  BP: 120/80  Pulse: 70  Temp: 98.3 F (36.8 C)  TempSrc: Oral  SpO2: 97%  Weight: 171 lb (77.6 kg)  Height: 5' 2.5" (1.588 m)     Body mass index is 30.78 kg/m.  Physical Exam:   Physical Exam  Constitutional: She appears well-developed. She is cooperative.  Non-toxic appearance. She does not have a sickly appearance. She does not appear ill. No distress.  Cardiovascular: Normal rate, regular rhythm, S1 normal, S2 normal, normal heart sounds and normal pulses.   No LE edema  Pulmonary/Chest: Effort normal and breath sounds normal.  Neurological: She is alert.  Normal sensation to R ring finger tip  Skin:  R ring finger laceration at tip of finger with copious amounts of dried blood -- cleaned with sterile saline and cotton swabs, no discharge, no necrosis, no evidence of infection, dermabond remains intact  Nursing note and vitals reviewed.       Assessment and Plan:    Sandra Gonzales was seen today for finger laceration.  Diagnoses and all orders for this visit:  Laceration of right ring finger without damage to nail, foreign body presence unspecified, subsequent encounter   Area cleaned with sterile saline. Appears to be healing well, no evidence of infection. Advised patient to clean area with warm soapy water briefly daily. Follow-up with [place] if any further concerns. Patient agreeable to plan.   . Reviewed expectations re: course of current medical issues. . Discussed self-management of symptoms. . Outlined signs and symptoms indicating need for more acute intervention. . Patient verbalized understanding and all questions were answered. . See orders for this visit as documented in the electronic medical record. . Patient received an After-Visit Summary.  CMA or LPN served as scribe during this visit. History, Physical, and Plan  performed by medical provider. Documentation and orders reviewed and attested to.  Sandra Coke, PA-C

## 2017-02-14 NOTE — Telephone Encounter (Signed)
Acute visit scheduled for finger with Sam at 3pm today.

## 2017-04-14 ENCOUNTER — Telehealth: Payer: Self-pay | Admitting: Physician Assistant

## 2017-04-14 NOTE — Telephone Encounter (Signed)
Patient called needing to speak to billing about her bill received from her last visit with Inda Coke, PA. Transferred the call to billing and provided the patient with the number.

## 2017-05-03 ENCOUNTER — Ambulatory Visit: Payer: Self-pay | Admitting: Sports Medicine

## 2017-05-17 ENCOUNTER — Other Ambulatory Visit: Payer: Self-pay | Admitting: Sports Medicine

## 2017-05-17 ENCOUNTER — Encounter: Payer: Self-pay | Admitting: Sports Medicine

## 2017-05-17 ENCOUNTER — Ambulatory Visit (INDEPENDENT_AMBULATORY_CARE_PROVIDER_SITE_OTHER): Payer: PRIVATE HEALTH INSURANCE

## 2017-05-17 ENCOUNTER — Ambulatory Visit (INDEPENDENT_AMBULATORY_CARE_PROVIDER_SITE_OTHER): Payer: Self-pay | Admitting: Sports Medicine

## 2017-05-17 VITALS — BP 123/67 | HR 69 | Resp 16

## 2017-05-17 DIAGNOSIS — M722 Plantar fascial fibromatosis: Secondary | ICD-10-CM

## 2017-05-17 DIAGNOSIS — M79672 Pain in left foot: Secondary | ICD-10-CM

## 2017-05-17 MED ORDER — TRIAMCINOLONE ACETONIDE 10 MG/ML IJ SUSP: 10.0000 mg | Freq: Once | INTRAMUSCULAR | Status: AC

## 2017-05-17 MED ORDER — DEXAMETHASONE SODIUM PHOSPHATE 120 MG/30ML IJ SOLN: 4.0000 mg | Freq: Once | INTRAMUSCULAR | Status: AC

## 2017-05-17 NOTE — Patient Instructions (Signed)

## 2017-05-17 NOTE — Progress Notes (Signed)
Subjective: Sandra Gonzales is a 61 y.o. female patient presents to office with complaint of heel pain on the left. Patient admits to post static dyskinesia for >1 month in duration. Patient has treated this problem with rest, goat lotion, and changing shoes with no complete relief. Admits pain feels sharp to heel. Retired and use to work out but stopped and that's when heel started to bother her. Denies any other pedal complaints.   ROS per nurse note  Patient Active Problem List   Diagnosis Date Noted  . H/O bone density study 09/09/2013  . Multinodular goiter 08/19/2013  . Personal history of DVT (deep vein thrombosis) 06/28/2013  . Anxiety 06/28/2013  . DJD (degenerative joint disease) 06/28/2013  . Menopausal hot flushes 06/28/2013  . Endometriosis 06/28/2013  . S/P abdominal hysterectomy and left salpingo-oophorectomy 06/28/2013  . Left ankle sprain 03/06/2012  . Hyperlipidemia 11/25/2010  . Seasonal allergies   . Cancer ([place])   . GERD (gastroesophageal reflux disease)   . Constipation   . Benign thyroid cyst     Current Outpatient Prescriptions on File Prior to Visit  Medication Sig Dispense Refill  . ALPRAZolam (XANAX) 0.5 MG tablet take 1 tablet by mouth at bedtime 2 times weekly as directed 20 tablet 1  . cetirizine (ZYRTEC) 10 MG tablet Take 10 mg by mouth daily.    . cholecalciferol (VITAMIN D) 1000 units tablet Take 2,000 Units by mouth daily.    Mariane Baumgarten Calcium (STOOL SOFTENER PO) Take by mouth.    . Ibuprofen-Diphenhydramine Cit (ADVIL PM PO) Take 1 tablet by mouth at bedtime.    Earney Navy Bicarbonate (ZEGERID OTC PO) Take 1 tablet by mouth daily.     No current facility-administered medications on file prior to visit.     Allergies  Allergen Reactions  . Sulfa Drugs Cross Reactors     hallucinations    Objective: Physical Exam General: The patient is alert and oriented x3 in no acute distress.  Dermatology: Skin is warm, dry and supple  bilateral lower extremities. Nails 1-10 are normal. There is no erythema, edema, no eccymosis, no open lesions present. Integument is otherwise unremarkable.  Vascular: Dorsalis Pedis pulse and Posterior Tibial pulse are 2/4 bilateral. Capillary fill time is immediate to all digits.  Neurological: Grossly intact to light touch with an achilles reflex of +2/5 and a  negative Tinel's sign bilateral.  Musculoskeletal: Tenderness to palpation at the medial calcaneal tubercale and through the insertion of the plantar fascia on the left foot. No pain with compression of calcaneus bilateral. No pain with tuning fork to calcaneus bilateral. No pain with calf compression bilateral. There is decreased Ankle joint range of motion bilateral. All other joints range of motion within normal limits bilateral. Strength 5/5 in all groups bilateral.   Gait: Unassisted, Antalgic avoid weight on left heel  Xray, Left foot:  Normal osseous mineralization. Joint spaces preserved. No fracture/dislocation/boney destruction. + posterior and inferior calcaneal spur present with mild thickening of plantar fascia. No other soft tissue abnormalities or radiopaque foreign bodies.   Assessment and Plan: Problem List Items Addressed This Visit    None    Visit Diagnoses    Plantar fasciitis, left    -  Primary   Relevant Medications   triamcinolone acetonide (KENALOG) 10 MG/ML injection 10 mg (Start on 05/17/2017 11:45 PM)   dexamethasone (DECADRON) injection 4 mg (Start on 05/17/2017 11:45 PM)   Foot pain, left          -  Complete examination performed.  -Xrays reviewed -Discussed with patient in detail the condition of plantar fasciitis, how this occurs and general treatment options. Explained both conservative and surgical treatments.  -After oral consent and aseptic prep, injected a mixture containing 1 ml of 2%  plain lidocaine, 1 ml 0.5% plain marcaine, 0.5 ml of kenalog 10 and 0.5 ml of dexamethasone phosphate  into left heel. Post-injection care discussed with patient.  -Applied plantar fascial strapping to left to keep intact for 5 days  -Recommended good supportive shoes -Explained to patient that if tapping works well, we will consider custom molded orthoses. -Explained and dispensed to patient daily stretching exercises. -Recommend patient to ice affected area 1-2x daily. -Patient to return to office in 4 weeks for follow up or sooner if problems or questions arise.  Landis Martins, DPM

## 2017-05-17 NOTE — Progress Notes (Signed)
   Subjective:    Patient ID: Sandra Gonzales, female    DOB: Feb 03, 1956, 61 y.o.   MRN: [id number]  HPI    Review of Systems  HENT: Positive for sinus pain.   Eyes: Positive for itching.  Hematological: Bruises/bleeds easily.  All other systems reviewed and are negative.      Objective:   Physical Exam        Assessment & Plan:

## 2017-06-14 ENCOUNTER — Ambulatory Visit (INDEPENDENT_AMBULATORY_CARE_PROVIDER_SITE_OTHER): Payer: PRIVATE HEALTH INSURANCE | Admitting: Sports Medicine

## 2017-06-14 DIAGNOSIS — M722 Plantar fascial fibromatosis: Secondary | ICD-10-CM

## 2017-06-14 DIAGNOSIS — M79672 Pain in left foot: Secondary | ICD-10-CM | POA: Diagnosis not present

## 2017-06-14 NOTE — Progress Notes (Signed)
   Subjective:    Patient ID: Sandra Gonzales, female    DOB: 19-Oct-1955, 61 y.o.   MRN: [id number]  HPI    Review of Systems  All other systems reviewed and are negative.      Objective:   Physical Exam        Assessment & Plan:

## 2017-06-14 NOTE — Patient Instructions (Signed)
[place] healthy feet for dry heels Superfeet orthotics from [employer] or [employer]

## 2017-06-14 NOTE — Progress Notes (Signed)
Subjective: Sandra Gonzales is a 61 y.o. female returns to office for follow up evaluation after Left heel injection for plantar fasciitis, injection #1 administered 3 weeks ago. Patient states that the injection seems to help her pain; pain is now 95% resolved. Patient states that taping really healed and that she has been complaint with stretching and icing with no issues. Patient denies any recent changes in medications or new problems since last visit.   Patient Active Problem List   Diagnosis Date Noted  . H/O bone density study 09/09/2013  . Multinodular goiter 08/19/2013  . Personal history of DVT (deep vein thrombosis) 06/28/2013  . Anxiety 06/28/2013  . DJD (degenerative joint disease) 06/28/2013  . Menopausal hot flushes 06/28/2013  . Endometriosis 06/28/2013  . S/P abdominal hysterectomy and left salpingo-oophorectomy 06/28/2013  . Left ankle sprain 03/06/2012  . Hyperlipidemia 11/25/2010  . Seasonal allergies   . Cancer ([place])   . GERD (gastroesophageal reflux disease)   . Constipation   . Benign thyroid cyst     Current Outpatient Prescriptions on File Prior to Visit  Medication Sig Dispense Refill  . ALPRAZolam (XANAX) 0.5 MG tablet take 1 tablet by mouth at bedtime 2 times weekly as directed 20 tablet 1  . cetirizine (ZYRTEC) 10 MG tablet Take 10 mg by mouth daily.    . cholecalciferol (VITAMIN D) 1000 units tablet Take 2,000 Units by mouth daily.    Mariane Baumgarten Calcium (STOOL SOFTENER PO) Take by mouth.    . Ibuprofen-Diphenhydramine Cit (ADVIL PM PO) Take 1 tablet by mouth at bedtime.    Earney Navy Bicarbonate (ZEGERID OTC PO) Take 1 tablet by mouth daily.     Current Facility-Administered Medications on File Prior to Visit  Medication Dose Route Frequency Provider Last Rate Last Dose  . dexamethasone (DECADRON) injection 4 mg  4 mg Intra-articular Once Afreen Siebels, DPM      . triamcinolone acetonide (KENALOG) 10 MG/ML injection 10 mg  10 mg Other Once  Landis Martins, DPM        Allergies  Allergen Reactions  . Sulfa Drugs Cross Reactors     hallucinations    Objective:   General:  Alert and oriented x 3, in no acute distress  Dermatology: Skin is warm, dry, and supple bilateral. Nails are within normal limits. There is no lower extremity erythema, no eccymosis, no open lesions present bilateral.   Vascular: Dorsalis Pedis and Posterior Tibial pedal pulses are 2/4 bilateral. + hair growth noted bilateral. Capillary Fill Time is 3 seconds in all digits. No varicosities, No edema bilateral lower extremities.   Neurological: Sensation grossly intact to light touch with an achilles reflex of +2 and a negative Tinel's sign bilateral. Vibratory, sharp/dull, Semmes Weinstein Monofilament within normal limits.   Musculoskeletal: There is no tenderness to palpation at the medial calcaneal tubercale and through the insertion of the plantar fascia on the Left foot. No pain with compression to calcaneus or application of tuning fork. There is decreased Ankle joint range of motion bilateral. All other joints range of motion  within normal limits bilateral. Strength 5/5 bilateral.   Assessment and Plan: Problem List Items Addressed This Visit    None    Visit Diagnoses    Foot pain, left    -  Primary   Plantar fasciitis, left          -Complete examination performed.  -Previous x-rays reviewed. -Re-Discussed with patient in detail the condition of plantar fasciitis, how this  occurs related to the foot type of the patient and general treatment options. -Recommend custom insoles; office to contact patient with coverage and if wants them to schedule with [facility]. If she does not want them then recommend superfeet -Continue with stretching, icing, good supportive shoes daily to prevent recurrence.  -Discussed long term care and reocurrence; will closely monitor; if fails to improve will consider other treatment modalities.  -Patient to return  to office for orthotics or sooner if problems or questions arise.  Landis Martins, DPM

## 2017-06-17 ENCOUNTER — Telehealth: Payer: Self-pay | Admitting: Sports Medicine

## 2017-06-17 NOTE — Telephone Encounter (Signed)
Left message for pt to call to discuss orthotic coverage. 

## 2017-11-15 ENCOUNTER — Encounter: Payer: Self-pay | Admitting: Internal Medicine

## 2017-11-24 ENCOUNTER — Ambulatory Visit: Payer: PRIVATE HEALTH INSURANCE | Admitting: Internal Medicine

## 2017-11-24 ENCOUNTER — Encounter: Payer: Self-pay | Admitting: Internal Medicine

## 2017-11-24 VITALS — BP 114/80 | HR 76 | Ht 62.5 in | Wt 190.0 lb

## 2017-11-24 DIAGNOSIS — R1310 Dysphagia, unspecified: Secondary | ICD-10-CM | POA: Diagnosis not present

## 2017-11-24 DIAGNOSIS — R1319 Other dysphagia: Secondary | ICD-10-CM

## 2017-11-24 DIAGNOSIS — K219 Gastro-esophageal reflux disease without esophagitis: Secondary | ICD-10-CM

## 2017-11-24 MED ORDER — PANTOPRAZOLE SODIUM 20 MG PO TBEC: 20.0000 mg | DELAYED_RELEASE_TABLET | ORAL | 6 refills | Status: DC

## 2017-11-24 NOTE — Patient Instructions (Signed)
We have sent the following medications to your pharmacy for you to pick up at your convenience:  Protonix  Please follow up in 2 months

## 2017-11-24 NOTE — Progress Notes (Signed)
HISTORY OF PRESENT ILLNESS:  Sandra Gonzales is pleasant a 62 y.o. female with past medical history as listed below. She is sent today by her primary care provider Dr. Coralyn Mark for evaluation of a chief complaint of acid reflux. The patient has not been seen in this office since 03/26/2013. At that time she was seen for acid reflux with intermittent solid food dysphagia. She was prescribed omeprazole 40 mg daily and scheduled for upper endoscopy with possible esophageal dilation. The patient canceled her endoscopy appointment due to concerns over cost. She has been lost to follow-up since. She has been using PPI in the form of Zegerid on demand over the years for classic reflux symptoms. A few weeks ago she awoke with 4 episodes of nocturnal reflux was severe burning and an episode of vomiting. Since that time she has been troubled with burning sensation, sore throat, and cough. She was placed on pantoprazole 20 mg twice daily by her PCP approximately one week ago. Symptoms have slightly improved. She has gained 16 pounds since her evaluation in 2014. No particular reflux precautions. I asked her about dysphagia and it sounds like she has mild problems. She also reports some nausea. GI review of systems is otherwise negative. She did undergo colonoscopy in 2008 (hyperplastic polyp) and again in September 2013. Negative except for melanosis and mild diverticulosis. Follow-up in 10 years recommended. She is on Zyrtec for allergies.  REVIEW OF SYSTEMS:  All non-GI ROS negative unless otherwise stated in the history of present illness except for sinus and allergy, back pain, cough, sleeping problems, sore throat, voice change  Past Medical History:  Diagnosis Date  . Allergy    hay fever  . Anxiety   . Benign thyroid cyst    previously drained, has not recurred  . Cancer ([place])    BCC on L lower eyelid, R nasal bridge &back  . Constipation   . Diverticulosis of colon (without mention of hemorrhage)  2008  . DVT (deep venous thrombosis) ([place])   . Endometriosis   . GERD (gastroesophageal reflux disease)   . Hepatic cyst   . Hx of colonic polyps 2008   Hyperplastic   . Hyperlipidemia 11/25/2010  . Left ankle sprain 03/06/2012  . Multinodular goiter   . Overweight(278.02)   . Vitamin D deficiency     Past Surgical History:  Procedure Laterality Date  . ABDOMINAL HYSTERECTOMY  01-22-11   still has ovaries  . basal cell left lower eyelid  9/11   bcc removed from R nasal bridge and back also  . ENDOMETRIAL ABLATION  8 years ago   For clotting, no further bleeding  . THYROID CYST EXCISION     aspirated  . torn miniscus left knee  1-09    Social History TONNIE STILLMAN  reports that she has never smoked. She has never used smokeless tobacco. She reports that she does not drink alcohol or use drugs.  family history includes Cancer in her brother; Diabetes in her brother, father, and paternal grandmother; Leukemia in her paternal grandfather; Lung cancer in her mother; Other in her father.  Allergies  Allergen Reactions  . Sulfa Drugs Cross Reactors     hallucinations       PHYSICAL EXAMINATION: Vital signs: BP 114/80   Pulse 76   Ht 5' 2.5" (1.588 m)   Wt 190 lb (86.2 kg)   BMI 34.20 kg/m   Constitutional: generally well-appearing, no acute distress Psychiatric: alert and oriented x3, cooperative Eyes:  extraocular movements intact, anicteric, conjunctiva pink Mouth: oral pharynx moist, no lesions. No thrush Neck: supple without megaly Lymph: no lymphadenopathy Cardiovascular: heart regular rate and rhythm, no murmur Lungs: clear to auscultation bilaterally Abdomen: soft,obese, nontender, nondistended, no obvious ascites, no peritoneal signs, normal bowel sounds, no organomegaly Rectal:omitted Extremities: no clubbing, cyanosis, or lower extremity edema bilaterally Skin: no lesions on visible extremities Neuro: No focal deficits. Cranial nerves  intact  ASSESSMENT:  #1. GERD. Recent severe nocturnal episodes.Persistent reflux symptoms at present. Mild dysphagia. Has not had upper endoscopy. #2. Colonoscopy 2008 in 2013 as described #3. Obesity with BMI 34.2  PLAN:  #1. Reflux precautions. Information provided. Also information on GERD provided #2. Weight loss. Important stressed #3. Prescribed pantoprazole 40 mg daily #4. Office follow-up in 8-10 weeks. If symptoms persist despite adherence to reflux precautions and regular medical therapy, then strongly consider endoscopy. Offered at this time but we decided to wait. #5. Repeat screening colonoscopy 2023  A copy of this consultation note has been sent to Dr. Coralyn Mark

## 2017-12-01 ENCOUNTER — Telehealth: Payer: Self-pay

## 2017-12-01 MED ORDER — PANTOPRAZOLE SODIUM 40 MG PO TBEC: 40.0000 mg | DELAYED_RELEASE_TABLET | Freq: Every day | ORAL | 3 refills | Status: DC

## 2017-12-01 NOTE — Telephone Encounter (Signed)
Sent Pantoprazole 20mg  to pharmacy by mistake instead of 40mg ; clarified with pharmacist that it should be 40mg 

## 2018-01-23 ENCOUNTER — Encounter: Payer: Self-pay | Admitting: Internal Medicine

## 2018-01-23 ENCOUNTER — Ambulatory Visit: Payer: PRIVATE HEALTH INSURANCE | Admitting: Internal Medicine

## 2018-01-23 VITALS — BP 98/60 | HR 62 | Ht 62.5 in | Wt 187.0 lb

## 2018-01-23 DIAGNOSIS — K219 Gastro-esophageal reflux disease without esophagitis: Secondary | ICD-10-CM | POA: Diagnosis not present

## 2018-01-23 MED ORDER — OMEPRAZOLE 20 MG PO CPDR: 20.0000 mg | DELAYED_RELEASE_CAPSULE | Freq: Every day | ORAL | 3 refills | Status: DC

## 2018-01-23 NOTE — Patient Instructions (Signed)
We have sent the following medications to your pharmacy for you to pick up at your convenience:  Omeprazole.  Please follow up in one year  

## 2018-01-23 NOTE — Progress Notes (Signed)
HISTORY OF PRESENT ILLNESS:  Sandra Gonzales is a 62 y.o. female who was evaluated 11/24/2017 for chronic GERD with 3 since severe nocturnal episodes and mild dysphagia. See that dictation. She was advised with regards to reflux precautions. She was prescribed pantoprazole 40 mg daily. She presents today for follow-up. Patient is pleased report that she has had no problems since initiating therapy. She is tolerating the medication well. GI review of systems is negative except for mild constipation for which she takes stool softer. She is up-to-date on colonoscopy with her last examinations 2008 and 2013.  REVIEW OF SYSTEMS:  All non-GI ROS negative except for fatigue, sleeping problems and back pain  Past Medical History:  Diagnosis Date  . Allergy    hay fever  . Anxiety   . Benign thyroid cyst    previously drained, has not recurred  . Cancer ([place])    BCC on L lower eyelid, R nasal bridge &back  . Constipation   . Diverticulosis of colon (without mention of hemorrhage) 2008  . DVT (deep venous thrombosis) ([place])   . Endometriosis   . GERD (gastroesophageal reflux disease)   . Hepatic cyst   . Hx of colonic polyps 2008   Hyperplastic   . Hyperlipidemia 11/25/2010  . Left ankle sprain 03/06/2012  . Multinodular goiter   . Overweight(278.02)   . Vitamin D deficiency     Past Surgical History:  Procedure Laterality Date  . ABDOMINAL HYSTERECTOMY  01-22-11   still has ovaries  . basal cell left lower eyelid  9/11   bcc removed from R nasal bridge and back also  . ENDOMETRIAL ABLATION  8 years ago   For clotting, no further bleeding  . THYROID CYST EXCISION     aspirated  . torn miniscus left knee  1-09    Social History Sandra Gonzales  reports that she has never smoked. She has never used smokeless tobacco. She reports that she does not drink alcohol or use drugs.  family history includes Cancer in her brother; Diabetes in her brother, father, and paternal grandmother;  Leukemia in her paternal grandfather; Lung cancer in her mother; Other in her father.  Allergies  Allergen Reactions  . Sulfa Antibiotics Other (See Comments)    hallunations  . Sulfa Drugs Cross Reactors     hallucinations       PHYSICAL EXAMINATION: Vital signs: BP 98/60   Pulse 62   Ht 5' 2.5" (1.588 m)   Wt 187 lb (84.8 kg)   BMI 33.66 kg/m   Constitutional: generally well-appearing, no acute distress Psychiatric: alert and oriented x3, cooperative Eyes: extraocular movements intact, anicteric, conjunctiva pink Mouth: oral pharynx moist, no lesions Neck: supple no lymphadenopathy Cardiovascular: heart regular rate and rhythm, no murmur Lungs: clear to auscultation bilaterally Abdomen: soft, obese, nontender, nondistended, no obvious ascites, no peritoneal signs, normal bowel sounds, no organomegaly Rectal:omitted Extremities: no clubbing, cyanosis, or lower extremity edema bilaterally Skin: no lesions on visible extremities Neuro: No focal deficits. Cranial nerves intact    ASSESSMENT:  #1. GERD. Symptoms improved on PPI #2. Obesity #3. Colon cancer screening up-to-date   PLAN:  #1. Reflux precautions with attention to weight loss #2. May try a lower dose of PPI (20 mg). Stay on this if helpful. If not resume 40 mg dosage #3. Routine office follow-up one year #4. Surveillance colonoscopy around 2023

## 2018-01-27 ENCOUNTER — Telehealth: Payer: Self-pay | Admitting: Internal Medicine

## 2018-01-27 MED ORDER — PANTOPRAZOLE SODIUM 20 MG PO TBEC: 20.0000 mg | DELAYED_RELEASE_TABLET | Freq: Every day | ORAL | 3 refills | Status: DC

## 2018-01-27 NOTE — Telephone Encounter (Signed)
Sent Pantoprazole 20mg 

## 2018-01-27 NOTE — Telephone Encounter (Signed)
Patient states she was originally prescribed pantoprazole 40mg , but wanted to change it to 20mg . Patient was then prescribed omeprazole 20mg , but states she does not want that, only pantoprazole. Please advise.

## 2018-05-03 ENCOUNTER — Telehealth: Payer: Self-pay | Admitting: Internal Medicine

## 2018-05-04 MED ORDER — PANTOPRAZOLE SODIUM 20 MG PO TBEC: 20.0000 mg | DELAYED_RELEASE_TABLET | Freq: Every day | ORAL | 3 refills | Status: DC

## 2018-05-04 NOTE — Telephone Encounter (Signed)
90 day supply of pantoprazole sent to pharmacy.  

## 2018-09-26 ENCOUNTER — Telehealth: Payer: Self-pay | Admitting: Internal Medicine

## 2018-09-26 MED ORDER — PANTOPRAZOLE SODIUM 20 MG PO TBEC: 20.0000 mg | DELAYED_RELEASE_TABLET | Freq: Every day | ORAL | 1 refills | Status: DC

## 2018-09-26 NOTE — Telephone Encounter (Signed)
Sent 90 day supply of Pantoprazole 20mg  per Dr. Blanch Media most recent note

## 2018-12-04 ENCOUNTER — Other Ambulatory Visit: Payer: Self-pay | Admitting: Internal Medicine

## 2018-12-04 NOTE — Telephone Encounter (Signed)
Pt called and wanted to have medication sent to [facility] [address].Address: [address], [place],  [address] Phone: [phone]

## 2018-12-05 ENCOUNTER — Other Ambulatory Visit: Payer: Self-pay

## 2018-12-05 MED ORDER — PANTOPRAZOLE SODIUM 40 MG PO TBEC: 40.0000 mg | DELAYED_RELEASE_TABLET | Freq: Every day | ORAL | 3 refills | Status: DC

## 2018-12-05 NOTE — Telephone Encounter (Signed)
Done

## 2019-11-21 DIAGNOSIS — L814 Other melanin hyperpigmentation: Secondary | ICD-10-CM | POA: Diagnosis not present

## 2019-11-21 DIAGNOSIS — D225 Melanocytic nevi of trunk: Secondary | ICD-10-CM | POA: Diagnosis not present

## 2019-11-21 DIAGNOSIS — L578 Other skin changes due to chronic exposure to nonionizing radiation: Secondary | ICD-10-CM | POA: Diagnosis not present

## 2019-11-21 DIAGNOSIS — Z23 Encounter for immunization: Secondary | ICD-10-CM | POA: Diagnosis not present

## 2019-11-21 DIAGNOSIS — L821 Other seborrheic keratosis: Secondary | ICD-10-CM | POA: Diagnosis not present

## 2019-11-21 DIAGNOSIS — Z85828 Personal history of other malignant neoplasm of skin: Secondary | ICD-10-CM | POA: Diagnosis not present

## 2020-02-29 DIAGNOSIS — H5213 Myopia, bilateral: Secondary | ICD-10-CM | POA: Diagnosis not present

## 2020-02-29 DIAGNOSIS — H2513 Age-related nuclear cataract, bilateral: Secondary | ICD-10-CM | POA: Diagnosis not present

## 2020-02-29 DIAGNOSIS — H35351 Cystoid macular degeneration, right eye: Secondary | ICD-10-CM | POA: Diagnosis not present

## 2020-04-16 DIAGNOSIS — M19049 Primary osteoarthritis, unspecified hand: Secondary | ICD-10-CM | POA: Diagnosis not present

## 2020-04-16 DIAGNOSIS — M6530 Trigger finger, unspecified finger: Secondary | ICD-10-CM | POA: Diagnosis not present

## 2020-04-16 DIAGNOSIS — K219 Gastro-esophageal reflux disease without esophagitis: Secondary | ICD-10-CM | POA: Diagnosis not present

## 2020-04-16 DIAGNOSIS — G5603 Carpal tunnel syndrome, bilateral upper limbs: Secondary | ICD-10-CM | POA: Diagnosis not present

## 2020-07-29 DIAGNOSIS — Z1231 Encounter for screening mammogram for malignant neoplasm of breast: Secondary | ICD-10-CM | POA: Diagnosis not present

## 2020-11-06 DIAGNOSIS — B029 Zoster without complications: Secondary | ICD-10-CM | POA: Diagnosis not present

## 2020-11-06 DIAGNOSIS — R221 Localized swelling, mass and lump, neck: Secondary | ICD-10-CM | POA: Diagnosis not present

## 2020-11-13 ENCOUNTER — Other Ambulatory Visit: Payer: Self-pay | Admitting: Family Medicine

## 2020-11-13 DIAGNOSIS — R221 Localized swelling, mass and lump, neck: Secondary | ICD-10-CM

## 2020-11-18 DIAGNOSIS — Z0000 Encounter for general adult medical examination without abnormal findings: Secondary | ICD-10-CM | POA: Diagnosis not present

## 2020-11-20 DIAGNOSIS — R739 Hyperglycemia, unspecified: Secondary | ICD-10-CM | POA: Diagnosis not present

## 2020-11-20 DIAGNOSIS — L814 Other melanin hyperpigmentation: Secondary | ICD-10-CM | POA: Diagnosis not present

## 2020-11-20 DIAGNOSIS — C44719 Basal cell carcinoma of skin of left lower limb, including hip: Secondary | ICD-10-CM | POA: Diagnosis not present

## 2020-11-20 DIAGNOSIS — Z23 Encounter for immunization: Secondary | ICD-10-CM | POA: Diagnosis not present

## 2020-11-20 DIAGNOSIS — L578 Other skin changes due to chronic exposure to nonionizing radiation: Secondary | ICD-10-CM | POA: Diagnosis not present

## 2020-11-20 DIAGNOSIS — Z1211 Encounter for screening for malignant neoplasm of colon: Secondary | ICD-10-CM | POA: Diagnosis not present

## 2020-11-20 DIAGNOSIS — D225 Melanocytic nevi of trunk: Secondary | ICD-10-CM | POA: Diagnosis not present

## 2020-11-20 DIAGNOSIS — Z85828 Personal history of other malignant neoplasm of skin: Secondary | ICD-10-CM | POA: Diagnosis not present

## 2020-11-20 DIAGNOSIS — Z1331 Encounter for screening for depression: Secondary | ICD-10-CM | POA: Diagnosis not present

## 2020-11-20 DIAGNOSIS — Z1339 Encounter for screening examination for other mental health and behavioral disorders: Secondary | ICD-10-CM | POA: Diagnosis not present

## 2020-11-20 DIAGNOSIS — Z0000 Encounter for general adult medical examination without abnormal findings: Secondary | ICD-10-CM | POA: Diagnosis not present

## 2020-11-20 DIAGNOSIS — R7989 Other specified abnormal findings of blood chemistry: Secondary | ICD-10-CM | POA: Diagnosis not present

## 2020-11-20 DIAGNOSIS — K219 Gastro-esophageal reflux disease without esophagitis: Secondary | ICD-10-CM | POA: Diagnosis not present

## 2020-11-20 DIAGNOSIS — L821 Other seborrheic keratosis: Secondary | ICD-10-CM | POA: Diagnosis not present

## 2020-11-20 DIAGNOSIS — D485 Neoplasm of uncertain behavior of skin: Secondary | ICD-10-CM | POA: Diagnosis not present

## 2020-11-27 ENCOUNTER — Other Ambulatory Visit: Payer: PRIVATE HEALTH INSURANCE

## 2020-12-12 ENCOUNTER — Ambulatory Visit
Admission: RE | Admit: 2020-12-12 | Discharge: 2020-12-12 | Disposition: A | Discharge status: Home / Self Care | Payer: PRIVATE HEALTH INSURANCE | Source: Ambulatory Visit | Attending: Family Medicine | Admitting: Family Medicine

## 2020-12-12 DIAGNOSIS — E041 Nontoxic single thyroid nodule: Secondary | ICD-10-CM | POA: Diagnosis not present

## 2020-12-12 DIAGNOSIS — R221 Localized swelling, mass and lump, neck: Secondary | ICD-10-CM

## 2020-12-19 DIAGNOSIS — R7989 Other specified abnormal findings of blood chemistry: Secondary | ICD-10-CM | POA: Diagnosis not present

## 2020-12-24 DIAGNOSIS — J309 Allergic rhinitis, unspecified: Secondary | ICD-10-CM | POA: Diagnosis not present

## 2020-12-24 DIAGNOSIS — G4700 Insomnia, unspecified: Secondary | ICD-10-CM | POA: Diagnosis not present

## 2020-12-24 DIAGNOSIS — R7989 Other specified abnormal findings of blood chemistry: Secondary | ICD-10-CM | POA: Diagnosis not present

## 2020-12-24 DIAGNOSIS — K219 Gastro-esophageal reflux disease without esophagitis: Secondary | ICD-10-CM | POA: Diagnosis not present

## 2020-12-24 DIAGNOSIS — E041 Nontoxic single thyroid nodule: Secondary | ICD-10-CM | POA: Diagnosis not present

## 2020-12-24 DIAGNOSIS — B029 Zoster without complications: Secondary | ICD-10-CM | POA: Diagnosis not present

## 2021-02-03 DIAGNOSIS — L905 Scar conditions and fibrosis of skin: Secondary | ICD-10-CM | POA: Diagnosis not present

## 2021-02-03 DIAGNOSIS — C44719 Basal cell carcinoma of skin of left lower limb, including hip: Secondary | ICD-10-CM | POA: Diagnosis not present

## 2021-03-04 DIAGNOSIS — H2513 Age-related nuclear cataract, bilateral: Secondary | ICD-10-CM | POA: Diagnosis not present

## 2021-03-04 DIAGNOSIS — H35351 Cystoid macular degeneration, right eye: Secondary | ICD-10-CM | POA: Diagnosis not present

## 2021-03-04 DIAGNOSIS — H5213 Myopia, bilateral: Secondary | ICD-10-CM | POA: Diagnosis not present

## 2021-06-02 ENCOUNTER — Ambulatory Visit: Payer: PPO | Admitting: Gastroenterology

## 2021-06-02 ENCOUNTER — Other Ambulatory Visit (INDEPENDENT_AMBULATORY_CARE_PROVIDER_SITE_OTHER): Payer: PPO

## 2021-06-02 ENCOUNTER — Encounter: Payer: Self-pay | Admitting: Gastroenterology

## 2021-06-02 VITALS — BP 120/68 | HR 56 | Ht 62.0 in | Wt 175.0 lb

## 2021-06-02 DIAGNOSIS — K5909 Other constipation: Secondary | ICD-10-CM

## 2021-06-02 DIAGNOSIS — K219 Gastro-esophageal reflux disease without esophagitis: Secondary | ICD-10-CM | POA: Diagnosis not present

## 2021-06-02 DIAGNOSIS — R1032 Left lower quadrant pain: Secondary | ICD-10-CM

## 2021-06-02 LAB — BASIC METABOLIC PANEL
BUN: 5 mg/dL — ABNORMAL LOW (ref 6–23)
CO2: 26 mEq/L (ref 19–32)
Calcium: 9.2 mg/dL (ref 8.4–10.5)
Chloride: 106 mEq/L (ref 96–112)
Creatinine, Ser: 0.56 mg/dL (ref 0.40–1.20)
GFR: 95.02 mL/min (ref >60.00)
Glucose, Bld: 98 mg/dL (ref 70–99)
Potassium: 3.7 mEq/L (ref 3.5–5.1)
Sodium: 140 mEq/L (ref 135–145)

## 2021-06-02 MED ORDER — HYOSCYAMINE SULFATE 0.125 MG SL SUBL: 0.1250 mg | SUBLINGUAL_TABLET | SUBLINGUAL | 1 refills | Status: AC | PRN

## 2021-06-02 NOTE — Patient Instructions (Signed)
If you are age 66 or older, your body mass index should be between 23-30. Your Body mass index is 32.01 kg/m. If this is out of the aforementioned range listed, please consider follow up with your Primary Care Provider. __________________________________________________________  The [place] [facility] [facility] providers would like to encourage you to use [facility] to communicate with providers for non-urgent requests or questions.  Due to long hold times on the telephone, sending your provider a message by [facility] may be a faster and more efficient way to get a response.  Please allow 48 business hours for a response.  Please remember that this is for non-urgent requests.   Your provider has requested that you go to the basement level for lab work before leaving today. Press "B" on the elevator. The lab is located at the first door on the left as you exit the elevator.  You have been scheduled for a CT scan of the abdomen and pelvis at [facility] ([address]---this is in the same building as [employer]).   You are scheduled on 06/04/2021 at 3:00 pm. You should arrive 15 minutes prior to your appointment time for registration. Please follow the written instructions below on the day of your exam:  WARNING: IF YOU ARE ALLERGIC TO IODINE/X-RAY DYE, PLEASE NOTIFY RADIOLOGY IMMEDIATELY AT [phone]! YOU WILL BE GIVEN A 13 HOUR PREMEDICATION PREP.  1) Do not eat after 11:00 am (4 hours prior to your test) 2) You have been given 2 bottles of oral contrast to drink. The solution may taste better if refrigerated, but do NOT add ice or any other liquid to this solution. Shake well before drinking.    Drink 1 bottle of contrast @ 1:00 pm (2 hours prior to your exam)  Drink 1 bottle of contrast @ 2:00 pm (1 hour prior to your exam)  You may take any medications as prescribed with a small amount of water, if necessary. If you take any of the following medications: METFORMIN, GLUCOPHAGE,  GLUCOVANCE, AVANDAMET, RIOMET, FORTAMET, [place] MET, JANUMET, GLUMETZA or METAGLIP, you MAY be asked to HOLD this medication 48 hours AFTER the exam.  The purpose of you drinking the oral contrast is to aid in the visualization of your intestinal tract. The contrast solution may cause some diarrhea. Depending on your individual set of symptoms, you may also receive an intravenous injection of x-ray contrast/dye. Plan on being at [facility] for 30 minutes or longer, depending on the type of exam you are having performed.  This test typically takes 30-45 minutes to complete.  If you have any questions regarding your exam or if you need to reschedule, you may call the CT department at [phone] between the hours of 8:00 am and 5:00 pm, Monday-Friday.  ____________________________________________________________  START Hyoscyamine 1 tablet every 4 hours as needed for abdominal pain.   START daily Calcium and Vitamin D supplement (Caltrate-D) You may need miralax and/or magnesium supplement daily in place of Colace.   Follow up pending the results of your CT.  Thank you for entrusting me with your care and choosing [facility].  Alonza Bogus, PA-C

## 2021-06-02 NOTE — Progress Notes (Signed)
Assessment and plan noted ?

## 2021-06-02 NOTE — Progress Notes (Signed)
06/02/2021 Sandra Gonzales [id number] [birth year]   HISTORY OF PRESENT ILLNESS: This is a pleasant 65 year old female who is a patient of Dr. Blanch Media.  She was last seen here in June 2019.  Last colonoscopy 04/2012 showed only melanosis coli and mild diverticulosis.  She presents here today with complaints of left lower quadrant abdominal pain.  The referral from her PCP, Dr. Ernie Hew, says "colonoscopy".  Nonetheless, she reports that she has been experiencing intermittent left lower quadrant abdominal pain for years, probably 12 years or so.  She recently describes having what she calls a flare or episode of this pain that began about a month ago after eating some almonds.  She says the day after eating almonds she started having the pain.  The pain was quite severe that day, but has lessened throughout the course of the month, but is better at certain times and worse at certain times.  She says that she has had a little bit of nausea and some diarrhea.  She says that last night she ate a Caesar salad and had diarrhea a couple of times in the middle the night.  She reports a constant uneasiness in her stomach.  She also wanted to discuss her PPI therapy.  She has been on pantoprazole 20 mg daily with good control of her symptoms for a while now.  Her PCP wanted her to come off of pantoprazole, however, due to long-term side effects.  She switched her to Pepcid daily, but she says that she has not been having great control of her symptoms and she finds herself still needing to take a pantoprazole anyways.  She tends to be chronically constipated with hard stools, but takes Colace stool softeners daily, she says on average about 4/day, and that seems to help her move her bowels regularly for the most part.   Past Medical History:  Diagnosis Date   Allergy    hay fever   Anxiety    Benign thyroid cyst    previously drained, has not recurred   Cancer ([place])    BCC on L lower eyelid, R nasal  bridge &back   Constipation    Diverticulosis of colon (without mention of hemorrhage) 2008   DVT (deep venous thrombosis) (HCC)    Endometriosis    GERD (gastroesophageal reflux disease)    Hepatic cyst    Hx of colonic polyps 2008   Hyperplastic    Hyperlipidemia 11/25/2010   Left ankle sprain 03/06/2012   Multinodular goiter    Overweight(278.02)    Vitamin D deficiency    Past Surgical History:  Procedure Laterality Date   ABDOMINAL HYSTERECTOMY  01-22-11   still has ovaries   basal cell left lower eyelid  9/11   bcc removed from R nasal bridge and back also   ENDOMETRIAL ABLATION  8 years ago   For clotting, no further bleeding   THYROID CYST EXCISION     aspirated   torn miniscus left knee  1-09    reports that she has never smoked. She has never used smokeless tobacco. She reports that she does not drink alcohol and does not use drugs. family history includes Cancer in her brother; Diabetes in her brother, father, and paternal grandmother; Leukemia in her paternal grandfather; Lung cancer in her mother; Other in her father. Allergies  Allergen Reactions   Sulfa Antibiotics Other (See Comments)    hallunations   Sulfa Drugs Cross Reactors     hallucinations  Outpatient Encounter Medications as of 06/02/2021  Medication Sig   cetirizine (ZYRTEC) 10 MG tablet Take 10 mg by mouth daily.   diphenhydramine-acetaminophen (TYLENOL PM) 25-500 MG TABS tablet Take 2 tablets by mouth at bedtime as needed.   Docusate Calcium (STOOL SOFTENER PO) Take by mouth.   Ibuprofen-Diphenhydramine Cit (ADVIL PM PO) Take 1 tablet by mouth at bedtime. Patient takes 2 tablets at bedtime   Melatonin 3 MG TABS Take 1 tablet by mouth at bedtime. Patient takes 10 mg at bedtime   pantoprazole (PROTONIX) 40 MG tablet Take 1 tablet (40 mg total) by mouth daily.   [DISCONTINUED] cholecalciferol (VITAMIN D) 1000 units tablet Take 2,000 Units by mouth daily.   [DISCONTINUED] ELDERBERRY PO Take 1  tablet by mouth daily.   Facility-Administered Encounter Medications as of 06/02/2021  Medication   dexamethasone (DECADRON) injection 4 mg   triamcinolone acetonide (KENALOG) 10 MG/ML injection 10 mg    REVIEW OF SYSTEMS  : All other systems reviewed and negative except where noted in the History of Present Illness.   PHYSICAL EXAM: BP 120/68   Pulse (!) 56   Ht 5\' 2"  (1.575 m)   Wt 175 lb (79.4 kg)   BMI 32.01 kg/m  General: Well developed white female in no acute distress Head: Normocephalic and atraumatic Eyes:  Sclerae anicteric, conjunctiva pink. Ears: Normal auditory acuity Lungs: Clear throughout to auscultation; no W/R/R. Heart: Regular rate and rhythm; no M/R/G. Abdomen: Soft, non-distended.  BS present.  Mild left sided TTP. Musculoskeletal: Symmetrical with no gross deformities  Skin: No lesions on visible extremities Extremities: No edema  Neurological: Alert oriented x 4, grossly non-focal Psychological:  Alert and cooperative. Normal mood and affect  ASSESSMENT AND PLAN: *Left lower quadrant abdominal pain: Has had pain in this area on and off for years.  This current episode started about a month ago and has been persistent, better at certain times and worse at certain times.  Does have diverticulosis as seen on previous colonoscopy.  We will plan for CT scan of the abdomen and pelvis with contrast.  We will check a BMP today to assess renal function.  I will send a prescription for Levsin 0.125 mg every 4-6 hours as needed for abdominal pain in the interim. *GERD: Symptoms are well controlled on pantoprazole 20 mg daily.  Her PCP changed to Pepcid as she wanted her to come off of PPI due ot concern of long-term PPI side effects, but she does not feel like symptoms are well controlled.  We discussed starting a daily calcium and vitamin D supplement such as Caltrate daily if she is going to continue on daily PPI therapy long-term.  If she were to go to famotidine again  she would likely need at least twice daily dosing.  For now she will continue pantoprazole at 20 mg daily and start the calcium/vitamin D supplement as we discussed. *Chronic constipation: Currently does well with Colace stool softeners, usually about 4/day.  We discussed that the calcium supplement may worsen her constipation and she may need to consider trying something else such as daily MiraLAX or if she wishes then a daily magnesium supplement (her daughter uses and suggested this).   CC:  Fanny Bien, MD

## 2021-06-04 ENCOUNTER — Ambulatory Visit (INDEPENDENT_AMBULATORY_CARE_PROVIDER_SITE_OTHER)
Admission: RE | Admit: 2021-06-04 | Discharge: 2021-06-04 | Disposition: A | Discharge status: Home / Self Care | Payer: PPO | Source: Ambulatory Visit | Attending: Gastroenterology | Admitting: Gastroenterology

## 2021-06-04 ENCOUNTER — Telehealth: Payer: Self-pay | Admitting: Gastroenterology

## 2021-06-04 ENCOUNTER — Other Ambulatory Visit: Payer: Self-pay

## 2021-06-04 DIAGNOSIS — K5909 Other constipation: Secondary | ICD-10-CM | POA: Diagnosis not present

## 2021-06-04 DIAGNOSIS — R1032 Left lower quadrant pain: Secondary | ICD-10-CM

## 2021-06-04 DIAGNOSIS — R109 Unspecified abdominal pain: Secondary | ICD-10-CM | POA: Diagnosis not present

## 2021-06-04 DIAGNOSIS — K219 Gastro-esophageal reflux disease without esophagitis: Secondary | ICD-10-CM

## 2021-06-04 DIAGNOSIS — K760 Fatty (change of) liver, not elsewhere classified: Secondary | ICD-10-CM | POA: Diagnosis not present

## 2021-06-04 MED ORDER — PANTOPRAZOLE SODIUM 40 MG PO TBEC: 40.0000 mg | DELAYED_RELEASE_TABLET | Freq: Every day | ORAL | 3 refills | Status: DC

## 2021-06-04 MED ORDER — IOHEXOL 350 MG/ML SOLN
100.0000 mL | Freq: Once | INTRAVENOUS | Status: AC | PRN
  Administered 2021-06-04: 100 mL via INTRAVENOUS

## 2021-06-04 NOTE — Telephone Encounter (Signed)
Inbound call from patient. States she needs a medication refill for pantoprazole to continue with the treatment plan from her visit.

## 2021-06-04 NOTE — Telephone Encounter (Signed)
Script sent to pharmacy.

## 2021-06-09 ENCOUNTER — Other Ambulatory Visit: Payer: Self-pay

## 2021-06-09 MED ORDER — CIPROFLOXACIN HCL 500 MG PO TABS: 500.0000 mg | ORAL_TABLET | Freq: Two times a day (BID) | ORAL | 0 refills | Status: DC

## 2021-06-09 MED ORDER — METRONIDAZOLE 500 MG PO TABS: 500.0000 mg | ORAL_TABLET | Freq: Three times a day (TID) | ORAL | 0 refills | Status: DC

## 2021-06-19 ENCOUNTER — Telehealth: Payer: Self-pay | Admitting: Gastroenterology

## 2021-06-19 ENCOUNTER — Other Ambulatory Visit: Payer: Self-pay

## 2021-06-19 MED ORDER — CIPROFLOXACIN HCL 500 MG PO TABS: 500.0000 mg | ORAL_TABLET | Freq: Two times a day (BID) | ORAL | 0 refills | Status: AC

## 2021-06-19 MED ORDER — METRONIDAZOLE 500 MG PO TABS: 500.0000 mg | ORAL_TABLET | Freq: Three times a day (TID) | ORAL | 0 refills | Status: AC

## 2021-06-19 NOTE — Telephone Encounter (Signed)
Inbound call from patient states she have finished the antibiotics metronidazole and citrofloxacin on Tuesday 10/25. Says she have still been experiencing LLQ pain

## 2021-06-19 NOTE — Telephone Encounter (Signed)
1.  Prescribe 1 additional week of ciprofloxacin and metronidazole 2.  Tylenol for pain 3.  Low residue diet.  Advance as tolerated  4.  Have her follow-up in the office with on Tuesday, November 1 at 2 PM Thanks, Dr. Henrene Pastor

## 2021-06-19 NOTE — Telephone Encounter (Signed)
The pt has been advised of the recommendations per Dr Henrene Pastor.  Prescriptions have been sent to the pharmacy.  Appt made for 06/23/21 at 2 pm.  She will follow a low residue diet and advance as tolerated.  She will take tylenol as needed for pain.  She will call back if things worsen over the weekend.

## 2021-06-19 NOTE — Telephone Encounter (Signed)
The pt was seen by Janett Billow on 10/11 for LLQ pain.  She had a CT scan on 10/13 that showed mildly inflamed sigmoid colonic diverticulum likely reflecting acute uncomplicated sigmoid diverticulitis. She was given cipro and flagyl for 7 days on 10/13.  She finished the abx on Tuesday 10/25 but states that she continues to have the same LLQ pain. It is no worse but no better.  Dr Henrene Pastor please advise Janett Billow is off today.

## 2021-06-23 ENCOUNTER — Other Ambulatory Visit (INDEPENDENT_AMBULATORY_CARE_PROVIDER_SITE_OTHER): Payer: PPO

## 2021-06-23 ENCOUNTER — Ambulatory Visit: Payer: PPO | Admitting: Internal Medicine

## 2021-06-23 ENCOUNTER — Encounter: Payer: Self-pay | Admitting: Internal Medicine

## 2021-06-23 VITALS — BP 104/70 | HR 62 | Ht 62.5 in | Wt 176.0 lb

## 2021-06-23 DIAGNOSIS — R935 Abnormal findings on diagnostic imaging of other abdominal regions, including retroperitoneum: Secondary | ICD-10-CM | POA: Diagnosis not present

## 2021-06-23 DIAGNOSIS — R1032 Left lower quadrant pain: Secondary | ICD-10-CM

## 2021-06-23 DIAGNOSIS — K5732 Diverticulitis of large intestine without perforation or abscess without bleeding: Secondary | ICD-10-CM

## 2021-06-23 DIAGNOSIS — K5909 Other constipation: Secondary | ICD-10-CM

## 2021-06-23 LAB — CBC WITH DIFFERENTIAL/PLATELET
Basophils Absolute: 0 10*3/uL (ref 0.0–0.1)
Basophils Relative: 0.8 % (ref 0.0–3.0)
Eosinophils Absolute: 0.2 10*3/uL (ref 0.0–0.7)
Eosinophils Relative: 2.9 % (ref 0.0–5.0)
HCT: 37.5 % (ref 36.0–46.0)
Hemoglobin: 12.1 g/dL (ref 12.0–15.0)
Lymphocytes Relative: 38.2 % (ref 12.0–46.0)
Lymphs Abs: 2.3 10*3/uL (ref 0.7–4.0)
MCHC: 32.3 g/dL (ref 30.0–36.0)
MCV: 82.3 fl (ref 78.0–100.0)
Monocytes Absolute: 0.4 10*3/uL (ref 0.1–1.0)
Monocytes Relative: 7.1 % (ref 3.0–12.0)
Neutro Abs: 3.1 10*3/uL (ref 1.4–7.7)
Neutrophils Relative %: 51 % (ref 43.0–77.0)
Platelets: 263 10*3/uL (ref 150.0–400.0)
RBC: 4.55 Mil/uL (ref 3.87–5.11)
RDW: 14.8 % (ref 11.5–15.5)
WBC: 6.1 10*3/uL (ref 4.0–10.5)

## 2021-06-23 MED ORDER — AMOXICILLIN-POT CLAVULANATE 875-125 MG PO TABS: 1.0000 | ORAL_TABLET | Freq: Two times a day (BID) | ORAL | 0 refills | Status: DC

## 2021-06-23 NOTE — Patient Instructions (Signed)
If you are age 66 or older, your body mass index should be between 23-30. Your Body mass index is 31.68 kg/m. If this is out of the aforementioned range listed, please consider follow up with your Primary Care Provider.  If you are age 27 or younger, your body mass index should be between 19-25. Your Body mass index is 31.68 kg/m. If this is out of the aformentioned range listed, please consider follow up with your Primary Care Provider.   ________________________________________________________  The [facility] providers would like to encourage you to use [facility] to communicate with providers for non-urgent requests or questions.  Due to long hold times on the telephone, sending your provider a message by [facility] may be a faster and more efficient way to get a response.  Please allow 48 business hours for a response.  Please remember that this is for non-urgent requests.  _______________________________________________________  Your provider has requested that you go to the basement level for lab work before leaving today. Press "B" on the elevator. The lab is located at the first door on the left as you exit the elevator.  We have sent the following medications to your pharmacy for you to pick up at your convenience:  Augmentin  Please follow up with Dr. Henrene Pastor on 07/07/2021 at 2:00pm

## 2021-06-23 NOTE — Progress Notes (Signed)
HISTORY OF PRESENT ILLNESS:  Sandra Gonzales is a 65 y.o. female with past medical history as listed below who has been seen in this office for chronic constipation.  She presents today with left lower quadrant pain that persists after recently being diagnosed with sigmoid diverticulitis.  Patient underwent colonoscopy in 2008 (hyperplastic polyp) in 2013 (sigmoid diverticulosis, melanosis).  She was seen in the office by the GI physician assistant June 02, 2021 for left lower quadrant pain.  CT scan of the abdomen and pelvis June 04, 2021 revealed sigmoid diverticulitis without complicating features.  She was prescribed ciprofloxacin and metronidazole for 7 days.  She completed her medications.  Despite this her left lower quadrant pain persisted.  She states that it may be a little worse since her office evaluation.  Last week we called her in additional ciprofloxacin and metronidazole.  Now on day 4.  The pain persists.  She describes it as constant.  There has been nausea which she attributes to the antibiotics.  Also some fatigue.  No fevers.  She is moving her bowels regularly.  She does take a stool softener with laxative as per her routine.  No urinary complaints.  REVIEW OF SYSTEMS:  All non-GI ROS negative unless otherwise stated in the HPI.  Past Medical History:  Diagnosis Date   Allergy    hay fever   Anxiety    Benign thyroid cyst    previously drained, has not recurred   Cancer ([place])    BCC on L lower eyelid, R nasal bridge &back   Constipation    Diverticulosis of colon (without mention of hemorrhage) 2008   DVT (deep venous thrombosis) (HCC)    Endometriosis    GERD (gastroesophageal reflux disease)    Hepatic cyst    Hx of colonic polyps 2008   Hyperplastic    Hyperlipidemia 11/25/2010   Left ankle sprain 03/06/2012   Multinodular goiter    Overweight(278.02)    Vitamin D deficiency     Past Surgical History:  Procedure Laterality Date   ABDOMINAL HYSTERECTOMY   01-22-11   still has ovaries   basal cell left lower eyelid  9/11   bcc removed from R nasal bridge and back also   ENDOMETRIAL ABLATION  8 years ago   For clotting, no further bleeding   THYROID CYST EXCISION     aspirated   torn miniscus left knee  1-09    Social History KRIZIA FLIGHT  reports that she has never smoked. She has never used smokeless tobacco. She reports that she does not drink alcohol and does not use drugs.  family history includes Cancer in her brother; Diabetes in her brother, father, and paternal grandmother; Leukemia in her paternal grandfather; Lung cancer in her mother; Other in her father.  Allergies  Allergen Reactions   Sulfa Antibiotics Other (See Comments)    hallunations   Sulfa Drugs Cross Reactors     hallucinations       PHYSICAL EXAMINATION: Vital signs: BP 104/70   Pulse 62   Ht 5' 2.5" (1.588 m)   Wt 176 lb (79.8 kg)   BMI 31.68 kg/m   Constitutional: generally well-appearing, no acute distress Psychiatric: alert and oriented x3, cooperative Eyes: extraocular movements intact, anicteric, conjunctiva pink Mouth: oral pharynx moist, no lesions Neck: supple no lymphadenopathy Cardiovascular: heart regular rate and rhythm, no murmur Lungs: clear to auscultation bilaterally Abdomen: soft, tenderness in left lower quadrant to palpation, nondistended, no obvious ascites, no  peritoneal signs, normal bowel sounds, no organomegaly Rectal: Omitted Extremities: no lower extremity edema bilaterally Skin: no lesions on visible extremities Neuro: No focal deficits.  Cranial nerves intact  ASSESSMENT:  1.  Sigmoid diverticulitis.  Persistent despite antibiotic therapy. 2.  Abnormal CT of the abdomen demonstrating what appears to be diverticulitis.  Uncomplicated. 3.  Chronic constipation.  The patient is moving her bowels regularly with stool softener/laxative 4.  Previous colonoscopy 2008, 2013  PLAN:  1.  CBC today 2.  Discontinue  ciprofloxacin and metronidazole 3.  Prescribe Augmentin 875 mg twice daily for 10 days.  Medication risks reviewed 4.  Routine office follow-up 2 weeks 5.  Continue modified diet Tylenol for pain 6.  If the patient's problems persist or worsen, then repeat imaging (CT) would be indicated.  Advised 7.  Colonoscopy to be considered

## 2021-07-07 ENCOUNTER — Ambulatory Visit: Payer: PPO | Admitting: Internal Medicine

## 2021-07-07 ENCOUNTER — Encounter: Payer: Self-pay | Admitting: Internal Medicine

## 2021-07-07 VITALS — BP 108/70 | HR 80 | Ht 62.5 in | Wt 176.1 lb

## 2021-07-07 DIAGNOSIS — K5732 Diverticulitis of large intestine without perforation or abscess without bleeding: Secondary | ICD-10-CM | POA: Diagnosis not present

## 2021-07-07 DIAGNOSIS — R1032 Left lower quadrant pain: Secondary | ICD-10-CM

## 2021-07-07 DIAGNOSIS — R935 Abnormal findings on diagnostic imaging of other abdominal regions, including retroperitoneum: Secondary | ICD-10-CM | POA: Diagnosis not present

## 2021-07-07 NOTE — Patient Instructions (Signed)
If you are age 66 or older, your body mass index should be between 23-30. Your Body mass index is 31.7 kg/m. If this is out of the aforementioned range listed, please consider follow up with your Primary Care Provider.  If you are age 60 or younger, your body mass index should be between 19-25. Your Body mass index is 31.7 kg/m. If this is out of the aformentioned range listed, please consider follow up with your Primary Care Provider.   ________________________________________________________  The [facility] providers would like to encourage you to use [facility] to communicate with providers for non-urgent requests or questions.  Due to long hold times on the telephone, sending your provider a message by [facility] may be a faster and more efficient way to get a response.  Please allow 48 business hours for a response.  Please remember that this is for non-urgent requests.  _______________________________________________________  Please follow up as needed

## 2021-07-07 NOTE — Progress Notes (Signed)
HISTORY OF PRESENT ILLNESS:  Sandra Gonzales is a 65 y.o. female who I saw in June 23, 2021 after a bout of sigmoid diverticulitis.  She was having persistent pain.  CT scan 1 month ago revealed uncomplicated diverticulitis.  She does have chronic constipation.  CBC at the time of her last visit was unremarkable.  Normal white blood cell count.  She was prescribed Augmentin 875 mg twice daily for 10 days.  She presents today for scheduled follow-up.  She tells me that she has had significant improvement.  She still notices twinges of pain but quite manageable.  No fevers.  No new complaints.  Last colonoscopy was September 2013.  REVIEW OF SYSTEMS:  All non-GI ROS negative unless otherwise stated in the HPI except for sinus and allergy, arthritis, sleeping problems  Past Medical History:  Diagnosis Date   Allergy    hay fever   Anxiety    Benign thyroid cyst    previously drained, has not recurred   Cancer ([place])    BCC on L lower eyelid, R nasal bridge &back   Constipation    Diverticulosis of colon (without mention of hemorrhage) 2008   DVT (deep venous thrombosis) (HCC)    Endometriosis    GERD (gastroesophageal reflux disease)    Hepatic cyst    Hx of colonic polyps 2008   Hyperplastic    Hyperlipidemia 11/25/2010   Left ankle sprain 03/06/2012   Multinodular goiter    Overweight(278.02)    Vitamin D deficiency     Past Surgical History:  Procedure Laterality Date   ABDOMINAL HYSTERECTOMY  01-22-11   still has ovaries   basal cell left lower eyelid  9/11   bcc removed from R nasal bridge and back also   ENDOMETRIAL ABLATION  8 years ago   For clotting, no further bleeding   THYROID CYST EXCISION     aspirated   torn miniscus left knee  1-09    Social History [name]  reports that she has never smoked. She has never used smokeless tobacco. She reports that she does not drink alcohol and does not use drugs.  family history includes Cancer in her brother;  Diabetes in her brother, father, and paternal grandmother; Leukemia in her paternal grandfather; Lung cancer in her mother; Other in her father.  Allergies  Allergen Reactions   Sulfa Antibiotics Other (See Comments)    hallunations   Sulfa Drugs Cross Reactors     hallucinations       PHYSICAL EXAMINATION: Vital signs: BP 108/70   Pulse 80   Ht 5' 2.5" (1.588 m)   Wt 176 lb 2 oz (79.9 kg)   SpO2 98%   BMI 31.70 kg/m   Constitutional: generally well-appearing, no acute distress Psychiatric: alert and oriented x3, cooperative Eyes: extraocular movements intact, anicteric, conjunctiva pink Mouth: oral pharynx moist, no lesions Neck: supple no lymphadenopathy Cardiovascular: heart regular rate and rhythm, no murmur Lungs: clear to auscultation bilaterally Abdomen: soft, nontender, nondistended, no obvious ascites, no peritoneal signs, normal bowel sounds, no organomegaly Rectal: Omitted Extremities: no clubbing, cyanosis, or lower extremity edema bilaterally Skin: no lesions on visible extremities Neuro: No focal deficits.  Cranial nerves intact  ASSESSMENT:  1.  Sigmoid diverticulitis.  Uncomplicated.  Resolved after 2 courses of antibiotic therapy. 2.  Colonoscopy 2008 and 2013 negative for neoplasia.  Last colonoscopy September 2013.  The patient did inquire about her follow-up colonoscopy.  We did offer it to her at  this time.  She prefers to wait until next year.   PLAN:  1.  High-fiber diet 2.  Contact the office for questions or problems 3.  Repeat screening colonoscopy next year

## 2021-07-23 DIAGNOSIS — H1820 Unspecified corneal edema: Secondary | ICD-10-CM | POA: Diagnosis not present

## 2021-07-23 DIAGNOSIS — T1511XA Foreign body in conjunctival sac, right eye, initial encounter: Secondary | ICD-10-CM | POA: Diagnosis not present

## 2021-08-04 DIAGNOSIS — Z1231 Encounter for screening mammogram for malignant neoplasm of breast: Secondary | ICD-10-CM | POA: Diagnosis not present

## 2021-08-31 ENCOUNTER — Telehealth: Payer: Self-pay | Admitting: Internal Medicine

## 2021-08-31 MED ORDER — PANTOPRAZOLE SODIUM 20 MG PO TBEC: 20.0000 mg | DELAYED_RELEASE_TABLET | Freq: Every day | ORAL | 2 refills | Status: DC

## 2021-08-31 NOTE — Telephone Encounter (Signed)
Inbound call from patient states she would like medication refill for pantoprazole 20mg  to [facility] [employer] on [address]

## 2021-08-31 NOTE — Telephone Encounter (Signed)
Protonix refilled

## 2021-09-01 DIAGNOSIS — Z23 Encounter for immunization: Secondary | ICD-10-CM | POA: Diagnosis not present

## 2021-09-01 DIAGNOSIS — M419 Scoliosis, unspecified: Secondary | ICD-10-CM | POA: Diagnosis not present

## 2021-09-01 DIAGNOSIS — Z7185 Encounter for immunization safety counseling: Secondary | ICD-10-CM | POA: Diagnosis not present

## 2021-09-01 DIAGNOSIS — M545 Low back pain, unspecified: Secondary | ICD-10-CM | POA: Diagnosis not present

## 2021-09-01 DIAGNOSIS — K219 Gastro-esophageal reflux disease without esophagitis: Secondary | ICD-10-CM | POA: Diagnosis not present

## 2021-09-01 DIAGNOSIS — G4700 Insomnia, unspecified: Secondary | ICD-10-CM | POA: Diagnosis not present

## 2021-09-01 DIAGNOSIS — M7918 Myalgia, other site: Secondary | ICD-10-CM | POA: Diagnosis not present

## 2021-09-01 DIAGNOSIS — J309 Allergic rhinitis, unspecified: Secondary | ICD-10-CM | POA: Diagnosis not present

## 2021-09-23 ENCOUNTER — Encounter: Payer: Self-pay | Admitting: Physical Therapy

## 2021-09-23 ENCOUNTER — Other Ambulatory Visit: Payer: Self-pay

## 2021-09-23 ENCOUNTER — Ambulatory Visit: Payer: PPO | Attending: Family Medicine | Admitting: Physical Therapy

## 2021-09-23 DIAGNOSIS — M6281 Muscle weakness (generalized): Secondary | ICD-10-CM

## 2021-09-23 DIAGNOSIS — R293 Abnormal posture: Secondary | ICD-10-CM | POA: Insufficient documentation

## 2021-09-23 DIAGNOSIS — M7918 Myalgia, other site: Secondary | ICD-10-CM | POA: Insufficient documentation

## 2021-09-23 NOTE — Therapy (Signed)
[facility] @ [place], [place], [address] Phone: [phone]   Fax:  [phone]  Physical Therapy Evaluation  Patient Details  Name: Sandra Gonzales MRN: [id number] Date of Birth: 1955/11/04 Referring Provider (PT): Fanny Bien, MD   Encounter Date: 09/23/2021   PT End of Session - 09/23/21 1529     Visit Number 1    Date for PT Re-Evaluation 11/04/21    Authorization Type healthstream advantage    PT Start Time 1446    PT Stop Time 1527    PT Time Calculation (min) 41 min    Activity Tolerance Patient tolerated treatment well    Behavior During Therapy [facility] for tasks assessed/performed             Past Medical History:  Diagnosis Date   Allergy    hay fever   Anxiety    Benign thyroid cyst    previously drained, has not recurred   Cancer ([place])    [place] on L lower eyelid, R nasal bridge &back   Constipation    Diverticulosis of colon (without mention of hemorrhage) 2008   DVT (deep venous thrombosis) ([place])    Endometriosis    GERD (gastroesophageal reflux disease)    Hepatic cyst    Hx of colonic polyps 2008   Hyperplastic    Hyperlipidemia 11/25/2010   Left ankle sprain 03/06/2012   Multinodular goiter    Overweight(278.02)    Vitamin D deficiency     Past Surgical History:  Procedure Laterality Date   ABDOMINAL HYSTERECTOMY  01-22-11   still has ovaries   basal cell left lower eyelid  9/11   bcc removed from R nasal bridge and back also   ENDOMETRIAL ABLATION  8 years ago   For clotting, no further bleeding   THYROID CYST EXCISION     aspirated   torn miniscus left knee  1-09    There were no vitals filed for this visit.    Subjective Assessment - 09/23/21 1448     Subjective Pt reports tightness and burning, indicating upper thoracic spine with hand, with rolling in bed with sharp pain with this. After a few months after this started, she still has mainly tightness all the  time and now has slightly improved since seeing MD.    How long can you sit comfortably? no limits    How long can you stand comfortably? no limits    How long can you walk comfortably? no limits    Patient Stated Goals to have less pain    Currently in Pain? Yes    Pain Location Back    Pain Orientation Mid    Pain Descriptors / Indicators Tightness;Tingling                [facility] PT Assessment - 09/23/21 0001       Assessment   Medical Diagnosis M79.18 (ICD-10-CM) - Lumbar muscle pain    Referring Provider (PT) Fanny Bien, MD    Onset Date/Surgical Date --   ~2-3 months ago   Prior Therapy yes years ago for a frozen shoulder, Lt torn meniscus      Precautions   Precautions None      Restrictions   Weight Bearing Restrictions No      Balance Screen   Has the patient fallen in the past 6 months No    Has the patient had a decrease in activity level because of  a fear of falling?  No    Is the patient reluctant to leave their home because of a fear of falling?  No      Home [place] residence    Living Arrangements Spouse/significant other      Prior Function   Level of [place] Retired    Leisure take care of grandchildren      Cognition   Overall Cognitive Status Within Functional Limits for tasks assessed      Observation/Other Assessments   Focus on Therapeutic Outcomes (FOTO)  79      Sensation   Light Touch Appears Intact   however reports sometimes at night bil fingers with go numb but not always     Coordination   Gross Motor Movements are Fluid and Coordinated Yes    Fine Motor Movements are Fluid and Coordinated Yes      Posture/Postural Control   Posture/Postural Control Postural limitations    Postural Limitations Rounded Shoulders;Posterior pelvic tilt      ROM / Strength   AROM / PROM / Strength AROM;Strength      AROM   Overall AROM Comments WFL      Strength   Overall  Strength Comments hip abduction 3+/5 and all other 4/5 throughout      Flexibility   Soft Tissue Assessment /Muscle Length yes      Palpation   Spinal mobility decreased vertebral moblity in prone at T1-T7 in P/A and with bil rotation    Palpation comment moderate muscle tightness felt throughout thoracic spine  and paraspinals however no pain reported only sensation differences at ~T2-4 per pt's pointing to area. This is sensation difference she reports is always there for the past few months. Addaday used in this region with pt reporting she felt less tight after but no change in sensation                        Objective measurements completed on examination: See above findings.                PT Education - 09/23/21 1529     Education Details pt educated on exam findings, HEP, POC    Person(s) Educated Patient    Methods Explanation;Demonstration;Tactile cues;Verbal cues;Handout    Comprehension Verbalized understanding;Returned demonstration              PT Short Term Goals - 09/23/21 1546       PT SHORT TERM GOAL #1   Title pt to be I with HEP    Time 3    Period Weeks    Status New    Target Date 10/14/21      PT SHORT TERM GOAL #2   Title pt to score at least 81 on FOTO for improved percieved function with decreased symptoms.    Time 3    Period Weeks    Status New    Target Date 10/14/21               PT Long Term Goals - 09/23/21 1547       PT LONG TERM GOAL #1   Title pt to be I with advanced HEP    Time 6    Period Weeks    Status New    Target Date 11/04/21      PT LONG TERM GOAL #2   Title pt to score at least  84 on FOTO for improved percieved function with decreased symptoms.    Time 6    Period Weeks    Target Date 11/04/21      PT LONG TERM GOAL #3   Title pt to demonstrate improved posture mechanics with upright midline posture for at least 30 mins without pain.    Time 6    Period Weeks    Status New     Target Date 11/04/21                    Plan - 09/23/21 1530     Clinical Impression Statement Pt is 66yo female presenting with referral for lumbar muscle pain and reports she went to MD after having 2-3 month history of what she pointed to as upper thoracic spine area, after having multiple nights waking with sharp pain in this area and then the sharp pain did not return but she has been very cautious with rolling in bed to not have this again. Pt reports she has since has tightness and tingling/burning in this area but no pain. Pt states this is there constantly and nothing relieves it or really makes it worse at this point and has seen a mild improvement since seeing MD. Pt found to have decreased vertebral mobility in thoracic spine, no change in pain levels or sensation differences during assessment. Pt also have bil hip weakness and mild decreased in bil hip flexibility. Pt did demonstrate deficits in posture mechanics and given HEP to target this area and motivated to attempt PT and would benefit from additional PT to further address deficits.    Personal Factors and Comorbidities Time since onset of injury/illness/exacerbation    Stability/Clinical Decision Making Stable/Uncomplicated    Clinical Decision Making Low    Rehab Potential Good    PT Frequency 2x / week    PT Duration 6 weeks    PT Treatment/Interventions ADLs/Self Care Home Management;Functional mobility training;Therapeutic activities;Therapeutic exercise;Balance training;Neuromuscular re-education;Manual techniques;Patient/family education;Taping;Passive range of motion;Dry needling;Energy conservation;Joint Manipulations;Spinal Manipulations    PT Next Visit Plan posture strengthening and manual work at thoracic spine; maybe dry needling?    PT Home Exercise Plan [email or web address]    Consulted and Agree with Plan of Care Patient             Patient will benefit from skilled therapeutic intervention in order to  improve the following deficits and impairments:  Decreased endurance, Pain, Impaired sensation, Decreased strength, Decreased mobility, Postural dysfunction, Improper body mechanics, Impaired flexibility, Decreased activity tolerance  Visit Diagnosis: Muscle weakness (generalized) - Plan: PT plan of care cert/re-cert  Abnormal posture - Plan: PT plan of care cert/re-cert     Problem List Patient Active Problem List   Diagnosis Date Noted   LLQ pain 06/02/2021   H/O bone density study 09/09/2013   Multinodular goiter 08/19/2013   Personal history of DVT (deep vein thrombosis) 06/28/2013   Anxiety 06/28/2013   DJD (degenerative joint disease) 06/28/2013   Menopausal hot flushes 06/28/2013   Endometriosis 06/28/2013   S/P abdominal hysterectomy and left salpingo-oophorectomy 06/28/2013   Left ankle sprain 03/06/2012   Hyperlipidemia 11/25/2010   Seasonal allergies    Cancer ([facility])    GERD (gastroesophageal reflux disease)    Chronic constipation    Benign thyroid cyst     Stacy Gardner, PT, DPT 02/01/233:51 PM   [facility] @ [place], [place], [address] Phone: [phone]   Fax:  [phone]  Name: JOI LEYVA MRN: [id number] Date of Birth: [birth year]

## 2021-09-24 ENCOUNTER — Ambulatory Visit: Payer: PPO | Admitting: Physical Therapy

## 2021-09-24 DIAGNOSIS — M7918 Myalgia, other site: Secondary | ICD-10-CM | POA: Diagnosis not present

## 2021-09-24 DIAGNOSIS — M6281 Muscle weakness (generalized): Secondary | ICD-10-CM

## 2021-09-24 DIAGNOSIS — R293 Abnormal posture: Secondary | ICD-10-CM

## 2021-09-24 NOTE — Therapy (Signed)
[facility] @ [place], [place], [address] Phone: [phone]   Fax:  [phone]  Physical Therapy Treatment  Patient Details  Name: Sandra Gonzales MRN: [id number] Date of Birth: 15-Oct-1955 Referring Provider (PT): Fanny Bien, MD   Encounter Date: 09/24/2021   PT End of Session - 09/24/21 0923     Visit Number 2    Date for PT Re-Evaluation 11/04/21    Authorization Type healthstream advantage    PT Start Time 0847    PT Stop Time 0930    PT Time Calculation (min) 43 min    Activity Tolerance Patient tolerated treatment well    Behavior During Therapy [facility] for tasks assessed/performed             Past Medical History:  Diagnosis Date   Allergy    hay fever   Anxiety    Benign thyroid cyst    previously drained, has not recurred   Cancer ([place])    [place] on L lower eyelid, R nasal bridge &back   Constipation    Diverticulosis of colon (without mention of hemorrhage) 2008   DVT (deep venous thrombosis) ([place])    Endometriosis    GERD (gastroesophageal reflux disease)    Hepatic cyst    Hx of colonic polyps 2008   Hyperplastic    Hyperlipidemia 11/25/2010   Left ankle sprain 03/06/2012   Multinodular goiter    Overweight(278.02)    Vitamin D deficiency     Past Surgical History:  Procedure Laterality Date   ABDOMINAL HYSTERECTOMY  01-22-11   still has ovaries   basal cell left lower eyelid  9/11   bcc removed from R nasal bridge and back also   ENDOMETRIAL ABLATION  8 years ago   For clotting, no further bleeding   THYROID CYST EXCISION     aspirated   torn miniscus left knee  1-09    There were no vitals filed for this visit.   Subjective Assessment - 09/24/21 0850     Subjective Pt reports no change as eval was just yesterday and hasn't had a chance to start HEP yet.    How long can you sit comfortably? no limits    How long can you stand comfortably? no limits    How long can  you walk comfortably? no limits    Patient Stated Goals to have less pain    Currently in Pain? No/denies                               [facility] Adult PT Treatment/Exercise - 09/24/21 0001       Exercises   Exercises Shoulder;Neck      Lumbar Exercises: Stretches   Other Lumbar Stretch Exercise supine trunk extension on foam roller x30s; open books on foam roller x10    Other Lumbar Stretch Exercise childs pose 2x30s; needle threaders 2x30s      Lumbar Exercises: Aerobic   UBE (Upper Arm Bike) 5 mins L1      Shoulder Exercises: Seated   Retraction Strengthening;Both;20 reps    Theraband Level (Shoulder Retraction) Level 3 (Green)    Row Strengthening;Both;20 reps    Theraband Level (Shoulder Row) Level 3 (Green)    Horizontal ABduction Strengthening;Both;20 reps    Theraband Level (Shoulder Horizontal ABduction) Level 3 (Green)    Diagonals Strengthening;Both;Theraband;20 reps    Theraband Level (Shoulder Diagonals)  Level 3 (Green)      Shoulder Exercises: Standing   Extension Strengthening;Both;Theraband      Modalities   Modalities Moist Heat   mid thoracic in supine 5 mins                    PT Education - 09/24/21 0923     Education Details Pt educated on proper techniques for all stretching and strengthening exercises    Person(s) Educated Patient    Methods Explanation;Demonstration;Tactile cues;Verbal cues    Comprehension Returned demonstration;Verbalized understanding              PT Short Term Goals - 09/23/21 1546       PT SHORT TERM GOAL #1   Title pt to be I with HEP    Time 3    Period Weeks    Status New    Target Date 10/14/21      PT SHORT TERM GOAL #2   Title pt to score at least 81 on FOTO for improved percieved function with decreased symptoms.    Time 3    Period Weeks    Status New    Target Date 10/14/21               PT Long Term Goals - 09/23/21 1547       PT LONG TERM GOAL #1   Title pt  to be I with advanced HEP    Time 6    Period Weeks    Status New    Target Date 11/04/21      PT LONG TERM GOAL #2   Title pt to score at least 84 on FOTO for improved percieved function with decreased symptoms.    Time 6    Period Weeks    Target Date 11/04/21      PT LONG TERM GOAL #3   Title pt to demonstrate improved posture mechanics with upright midline posture for at least 30 mins without pain.    Time 6    Period Weeks    Status New    Target Date 11/04/21                   Plan - 09/24/21 1540     Clinical Impression Statement Pt presents to clinic reporting she has not seen changes due to evaluation being yesterday. Pt session focused on stretching and strengthening for posture and improved mobility at thoracic spine. Pt tolerated well with minimal rest breaks. Pt reports she would like to use heat at end of session to see if this decreases sensation in thoracic midline spine, this was completed in supine with 3 layers of coverage. Pt tolerated well and denies any pain. Pt did demonstrate deficits in posture mechanics and given HEP to target this area and motivated to attempt PT and would benefit from additional PT to further address deficits.    Personal Factors and Comorbidities Time since onset of injury/illness/exacerbation    Stability/Clinical Decision Making Stable/Uncomplicated    Rehab Potential Good    PT Frequency 2x / week    PT Duration 6 weeks    PT Treatment/Interventions ADLs/Self Care Home Management;Functional mobility training;Therapeutic activities;Therapeutic exercise;Balance training;Neuromuscular re-education;Manual techniques;Patient/family education;Taping;Passive range of motion;Dry needling;Energy conservation;Joint Manipulations;Spinal Manipulations    PT Next Visit Plan posture strengthening and manual work at thoracic spine; maybe dry needling?    PT Home Exercise Plan [facility]    Consulted and Agree with Plan of Care Patient  Patient will benefit from skilled therapeutic intervention in order to improve the following deficits and impairments:  Decreased endurance, Pain, Impaired sensation, Decreased strength, Decreased mobility, Postural dysfunction, Improper body mechanics, Impaired flexibility, Decreased activity tolerance  Visit Diagnosis: Abnormal posture  Muscle weakness (generalized)     Problem List Patient Active Problem List   Diagnosis Date Noted   LLQ pain 06/02/2021   H/O bone density study 09/09/2013   Multinodular goiter 08/19/2013   Personal history of DVT (deep vein thrombosis) 06/28/2013   Anxiety 06/28/2013   DJD (degenerative joint disease) 06/28/2013   Menopausal hot flushes 06/28/2013   Endometriosis 06/28/2013   S/P abdominal hysterectomy and left salpingo-oophorectomy 06/28/2013   Left ankle sprain 03/06/2012   Hyperlipidemia 11/25/2010   Seasonal allergies    Cancer (HCC)    GERD (gastroesophageal reflux disease)    Chronic constipation    Benign thyroid cyst    Stacy Gardner, PT, DPT 02/02/239:31 AM   [facility] @ [place], [place], [address] Phone: [phone]   Fax:  [phone]  Name: Sandra Gonzales MRN: [id number] Date of Birth: March 08, 1956

## 2021-10-01 ENCOUNTER — Other Ambulatory Visit: Payer: Self-pay

## 2021-10-01 ENCOUNTER — Encounter: Payer: Self-pay | Admitting: Rehabilitative and Restorative Service Providers"

## 2021-10-01 ENCOUNTER — Ambulatory Visit: Payer: PPO | Admitting: Rehabilitative and Restorative Service Providers"

## 2021-10-01 DIAGNOSIS — M7918 Myalgia, other site: Secondary | ICD-10-CM | POA: Diagnosis not present

## 2021-10-01 DIAGNOSIS — M6281 Muscle weakness (generalized): Secondary | ICD-10-CM

## 2021-10-01 DIAGNOSIS — R293 Abnormal posture: Secondary | ICD-10-CM

## 2021-10-01 NOTE — Therapy (Signed)
[facility] @ [place], [place], [address] Phone: [phone]   Fax:  [phone]  Physical Therapy Treatment  Patient Details  Name: Sandra Gonzales MRN: [id number] Date of Birth: [birth year] Referring Provider (PT): Fanny Bien, MD   Encounter Date: 10/01/2021   PT End of Session - 10/01/21 1148     Visit Number 3    Date for PT Re-Evaluation 11/04/21    Authorization Type [employer]althtream advantage    PT Start Time 1145    PT Stop Time 1225    PT Time Calculation (min) 40 min    Activity Tolerance Patient tolerated treatment well    Behavior During Therapy [facility] for tasks assessed/performed             Past Medical History:  Diagnosis Date   Allergy    hay fever   Anxiety    Benign thyroid cyst    previously drained, has not recurred   Cancer ([place])    [place] on L lower eyelid, R nasal bridge &back   Constipation    Diverticulosis of colon (without mention of hemorrhage) 2008   DVT (deep venous thrombosis) ([place])    Endometriosis    GERD (gastroesophageal reflux disease)    Hepatic cyst    Hx of colonic polyps 2008   Hyperplastic    Hyperlipidemia 11/25/2010   Left ankle sprain 03/06/2012   Multinodular goiter    Overweight(278.02)    Vitamin D deficiency     Past Surgical History:  Procedure Laterality Date   ABDOMINAL HYSTERECTOMY  01-22-11   still has ovaries   basal cell left lower eyelid  9/11   bcc removed from R nasal bridge and back also   ENDOMETRIAL ABLATION  8 years ago   For clotting, no further bleeding   THYROID CYST EXCISION     aspirated   torn miniscus left knee  1-09    There were no vitals filed for this visit.   Subjective Assessment - 10/01/21 1148     Subjective Pt reports that she has been doing her HEP and is feeling some better.    Patient Stated Goals to have less pain    Currently in Pain? No/denies                               [facility]  Adult PT Treatment/Exercise - 10/01/21 0001       Lumbar Exercises: Stretches   Pelvic Tilt 10 reps    Pelvic Tilt Limitations 4 way pelvic tilt seated on dynadisc x10 each    Piriformis Stretch Right;Left;2 reps;20 seconds    Piriformis Stretch Limitations in sitting    Other Lumbar Stretch Exercise 3 way pball rollout in sitting 5 sec hold x5 each      Lumbar Exercises: Aerobic   UBE (Upper Arm Bike) L1 x3 min fwd/backward each      Lumbar Exercises: Standing   Functional Squats Limitations 2x10 with UE support    Row Strengthening;Both;20 reps    Theraband Level (Row) Level 3 (Green)    Shoulder Extension Strengthening;Both;20 reps    Theraband Level (Shoulder Extension) Level 3 (Green)    Other Standing Lumbar Exercises shoulder ER and horizontal abduction with green tband 2x10 each.    Other Standing Lumbar Exercises B shoulder PNF D2 x10 with green tband.  Pallof press with green tband x10 B.  Lumbar Exercises: Seated   Sit to Stand 20 reps    Sit to Stand Limitations holding 5# kettlebell:  x10 with chest press, x10 with overhead press    Other Seated Lumbar Exercises Thoracic extension with ball behind back x20 reps with 2 sec hold      Knee/Hip Exercises: Standing   Walking with Sports Cord 10# back/fwd walking with cuing for maintaining core stability x10 reps                       PT Short Term Goals - 10/01/21 1235       PT SHORT TERM GOAL #1   Title pt to be I with HEP    Status On-going      PT SHORT TERM GOAL #2   Title pt to score at least 81 on FOTO for improved percieved function with decreased symptoms.    Status On-going               PT Long Term Goals - 09/23/21 1547       PT LONG TERM GOAL #1   Title pt to be I with advanced HEP    Time 6    Period Weeks    Status New    Target Date 11/04/21      PT LONG TERM GOAL #2   Title pt to score at least 84 on FOTO for improved percieved function with decreased symptoms.     Time 6    Period Weeks    Target Date 11/04/21      PT LONG TERM GOAL #3   Title pt to demonstrate improved posture mechanics with upright midline posture for at least 30 mins without pain.    Time 6    Period Weeks    Status New    Target Date 11/04/21                   Plan - 10/01/21 1232     Clinical Impression Statement Ms Camarena reports compliance with HEP since last visit and reports that the exercises and stretches do seem to be helping.  Pt able to tolerate ther ex and progression in clinic well and did not report any increased pain.  Following pelvic tilts on dynadisc at end of session, pt did state that she could feel muscle fatigue.  Pt denied pain at end of session. Pt continues to require skilled PT to progress towards goal related activities.    Personal Factors and Comorbidities Time since onset of injury/illness/exacerbation    PT Treatment/Interventions ADLs/Self Care Home Management;Functional mobility training;Therapeutic activities;Therapeutic exercise;Balance training;Neuromuscular re-education;Manual techniques;Patient/family education;Taping;Passive range of motion;Dry needling;Energy conservation;Joint Manipulations;Spinal Manipulations    PT Next Visit Plan posture strengthening and manual work at thoracic spine; manual therapy and dry needling if indicated    PT Home Exercise Plan [facility]    Consulted and Agree with Plan of Care Patient             Patient will benefit from skilled therapeutic intervention in order to improve the following deficits and impairments:  Decreased endurance, Pain, Impaired sensation, Decreased strength, Decreased mobility, Postural dysfunction, Improper body mechanics, Impaired flexibility, Decreased activity tolerance  Visit Diagnosis: Abnormal posture  Muscle weakness (generalized)     Problem List Patient Active Problem List   Diagnosis Date Noted   LLQ pain 06/02/2021   H/O bone density study 09/09/2013    Multinodular goiter 08/19/2013   Personal history of DVT (  deep vein thrombosis) 06/28/2013   Anxiety 06/28/2013   DJD (degenerative joint disease) 06/28/2013   Menopausal hot flushes 06/28/2013   Endometriosis 06/28/2013   S/P abdominal hysterectomy and left salpingo-oophorectomy 06/28/2013   Left ankle sprain 03/06/2012   Hyperlipidemia 11/25/2010   Seasonal allergies    Cancer (HCC)    GERD (gastroesophageal reflux disease)    Chronic constipation    Benign thyroid cyst     Juel Burrow, PT, DPT 10/01/2021, 12:36 PM  [facility] @ [place], [place], [address] Phone: [phone]   Fax:  [phone]  Name: DEBRINA KIZER MRN: [id number] Date of Birth: 03-10-1956

## 2021-10-07 ENCOUNTER — Encounter: Payer: PPO | Admitting: Physical Therapy

## 2021-10-09 ENCOUNTER — Encounter: Payer: PPO | Admitting: Rehabilitative and Restorative Service Providers"

## 2021-10-16 ENCOUNTER — Encounter (HOSPITAL_BASED_OUTPATIENT_CLINIC_OR_DEPARTMENT_OTHER): Payer: Self-pay | Admitting: *Deleted

## 2021-10-16 ENCOUNTER — Emergency Department (HOSPITAL_BASED_OUTPATIENT_CLINIC_OR_DEPARTMENT_OTHER): Payer: PPO

## 2021-10-16 ENCOUNTER — Emergency Department (HOSPITAL_BASED_OUTPATIENT_CLINIC_OR_DEPARTMENT_OTHER)
Admission: EM | Admit: 2021-10-16 | Discharge: 2021-10-16 | Disposition: A | Discharge status: Home / Self Care | Payer: PPO | Attending: Emergency Medicine | Admitting: Emergency Medicine

## 2021-10-16 ENCOUNTER — Other Ambulatory Visit: Payer: Self-pay

## 2021-10-16 DIAGNOSIS — Y9301 Activity, walking, marching and hiking: Secondary | ICD-10-CM | POA: Insufficient documentation

## 2021-10-16 DIAGNOSIS — W01198A Fall on same level from slipping, tripping and stumbling with subsequent striking against other object, initial encounter: Secondary | ICD-10-CM | POA: Insufficient documentation

## 2021-10-16 DIAGNOSIS — S0990XA Unspecified injury of head, initial encounter: Secondary | ICD-10-CM | POA: Diagnosis not present

## 2021-10-16 DIAGNOSIS — R220 Localized swelling, mass and lump, head: Secondary | ICD-10-CM | POA: Diagnosis not present

## 2021-10-16 DIAGNOSIS — S0083XA Contusion of other part of head, initial encounter: Secondary | ICD-10-CM | POA: Diagnosis not present

## 2021-10-16 DIAGNOSIS — S060X0A Concussion without loss of consciousness, initial encounter: Secondary | ICD-10-CM | POA: Diagnosis not present

## 2021-10-16 DIAGNOSIS — Y92009 Unspecified place in unspecified non-institutional (private) residence as the place of occurrence of the external cause: Secondary | ICD-10-CM | POA: Diagnosis not present

## 2021-10-16 MED ORDER — ACETAMINOPHEN 500 MG PO TABS
1000.0000 mg | ORAL_TABLET | Freq: Once | ORAL | Status: AC
Start: 2021-10-16 — End: 2021-10-16
  Administered 2021-10-16: 1000 mg via ORAL
  Filled 2021-10-16: qty 2

## 2021-10-16 MED ORDER — ONDANSETRON 4 MG PO TBDP
4.0000 mg | ORAL_TABLET | Freq: Once | ORAL | Status: AC
  Administered 2021-10-16: 4 mg via ORAL
  Filled 2021-10-16: qty 1

## 2021-10-16 NOTE — ED Triage Notes (Signed)
Pt slipped and fell on hardwood floors.  Struck right eyebrow and has large hematoma.  Pt has some nausea.  Pt is not on any blood thinners.

## 2021-10-16 NOTE — ED Provider Notes (Signed)
[facility] DEPT Provider Note   CSN: [id number] Arrival date & time: 10/16/21  1124     History  Chief Complaint  Patient presents with   Sandra Gonzales is a 66 y.o. female.  Is a 66 year old female with a history of prior DVT, hyperlipidemia, GERD who is not currently on any anticoagulation presenting today after a fall.  She was cleaning out her porch and when she walked into her house with crocs on her foot slipped on the floor and she fell forward hitting her right side of her face on the wooden floor.  She did not lose consciousness but reports feeling dazed.  She felt out of it and a little bit lightheaded but was able to get up and was able to stand.  She denies any visual changes, unilateral weakness or numbness.  No pain in the jaw or mouth.  She denies any neck pain.  No pain in her arms or legs.  She was able to stand and walk and denies any pain.  Only pain is on the right side of her face and head.  She has felt slightly nauseated but denies any vomiting.  Husband denies any altered mental status.  The history is provided by the patient and the spouse.  Fall      Home Medications Prior to Admission medications   Medication Sig Start Date End Date Taking? Authorizing Provider  cetirizine (ZYRTEC) 10 MG tablet Take 10 mg by mouth daily.    [provider]  diphenhydramine-acetaminophen (TYLENOL PM) 25-500 MG TABS tablet Take 2 tablets by mouth at bedtime as needed.    [provider]  diphenhydrAMINE-APAP, sleep, (TYLENOL PM EXTRA STRENGTH PO) Take by mouth.    [provider]  Docusate Calcium (STOOL SOFTENER PO) Take by mouth.    [provider]  hyoscyamine (LEVSIN SL) 0.125 MG SL tablet Place 1 tablet (0.125 mg total) under the tongue every 4 (four) hours as needed (Abdominal Pain). 06/02/21   Zehr, Laban Emperor, PA-C  Melatonin 3 MG TABS Take 1 tablet by mouth at bedtime. Patient takes 10 mg at  bedtime    [provider]  pantoprazole (PROTONIX) 20 MG tablet Take 1 tablet (20 mg total) by mouth daily. 08/31/21   Irene Shipper, MD      Allergies    Sulfa antibiotics and Sulfa drugs cross reactors    Review of Systems   Review of Systems  Physical Exam Updated Vital Signs BP 114/88    Pulse 71    Temp 98.6 F (37 C) (Oral)    Resp 16    Wt 79.8 kg    SpO2 95%    BMI 31.68 kg/m  Physical Exam Vitals and nursing note reviewed.  Constitutional:      General: She is not in acute distress.    Appearance: She is well-developed.  HENT:     Head: Normocephalic.      Right Ear: Tympanic membrane normal.     Left Ear: Tympanic membrane normal.     Mouth/Throat:     Mouth: Mucous membranes are moist.  Eyes:     Extraocular Movements: Extraocular movements intact.     Conjunctiva/sclera: Conjunctivae normal.     Pupils: Pupils are equal, round, and reactive to light.  Cardiovascular:     Rate and Rhythm: Normal rate and regular rhythm.     Heart sounds: No murmur heard. Pulmonary:     Effort:  Pulmonary effort is normal. No respiratory distress.     Breath sounds: Normal breath sounds. No wheezing or rales.  Abdominal:     General: There is no distension.     Palpations: Abdomen is soft.     Tenderness: There is no abdominal tenderness. There is no guarding or rebound.  Musculoskeletal:        General: No tenderness. Normal range of motion.     Cervical back: Normal range of motion and neck supple.     Comments: No tenderness noted to bilateral shoulders, elbows, wrists, hips, knees or ankles  Skin:    General: Skin is warm and dry.     Findings: No erythema or rash.  Neurological:     Mental Status: She is alert and oriented to person, place, and time. Mental status is at baseline.     Cranial Nerves: No cranial nerve deficit.     Sensory: No sensory deficit.     Motor: No weakness.     Gait: Gait normal.  Psychiatric:        Mood and Affect: Mood normal.         Behavior: Behavior normal.    ED Results / Procedures / Treatments   Labs (all labs ordered are listed, but only abnormal results are displayed) Labs Reviewed - No data to display  EKG None  Radiology CT Head Wo Contrast  Result Date: 10/16/2021 CLINICAL DATA:  Fall, head trauma EXAM: CT HEAD WITHOUT CONTRAST CT MAXILLOFACIAL WITHOUT CONTRAST TECHNIQUE: Multidetector CT imaging of the head and maxillofacial structures were performed using the standard protocol without intravenous contrast. Multiplanar CT image reconstructions of the maxillofacial structures were also generated. RADIATION DOSE REDUCTION: This exam was performed according to the departmental dose-optimization program which includes automated exposure control, adjustment of the mA and/or kV according to patient size and/or use of iterative reconstruction technique. COMPARISON:  None. FINDINGS: CT HEAD FINDINGS Brain: There is no evidence of acute intracranial hemorrhage, extra-axial fluid collection, or acute infarct. Parenchymal volume is normal. The ventricles are normal in size. Gray-white differentiation is preserved. There is no mass lesion. There is no midline shift. Vascular: No hyperdense vessel or unexpected calcification. Skull: Normal. Negative for fracture or focal lesion. Other: None. CT MAXILLOFACIAL FINDINGS Osseous: There is no acute facial bone fracture. There is no evidence of mandibular dislocation. There is no suspicious osseous lesion. Orbits: The globes are mildly elongated, likely reflecting sequela of myopia. There is no evidence of traumatic injury. Sinuses: There is mild mucosal thickening in the paranasal sinuses. Soft tissues: There is mild swelling in the right periorbital soft tissues. Thyroid is diffusely heterogeneous, previously evaluated by ultrasound. IMPRESSION: 1. No acute intracranial hemorrhage or calvarial fracture. 2. Mild swelling in the right periorbital soft tissues without underlying  fracture. Electronically Signed   By: Valetta Mole M.D.   On: 10/16/2021 12:42   CT Maxillofacial Wo Contrast  Result Date: 10/16/2021 CLINICAL DATA:  Fall, head trauma EXAM: CT HEAD WITHOUT CONTRAST CT MAXILLOFACIAL WITHOUT CONTRAST TECHNIQUE: Multidetector CT imaging of the head and maxillofacial structures were performed using the standard protocol without intravenous contrast. Multiplanar CT image reconstructions of the maxillofacial structures were also generated. RADIATION DOSE REDUCTION: This exam was performed according to the departmental dose-optimization program which includes automated exposure control, adjustment of the mA and/or kV according to patient size and/or use of iterative reconstruction technique. COMPARISON:  None. FINDINGS: CT HEAD FINDINGS Brain: There is no evidence of acute intracranial hemorrhage, extra-axial  fluid collection, or acute infarct. Parenchymal volume is normal. The ventricles are normal in size. Gray-white differentiation is preserved. There is no mass lesion. There is no midline shift. Vascular: No hyperdense vessel or unexpected calcification. Skull: Normal. Negative for fracture or focal lesion. Other: None. CT MAXILLOFACIAL FINDINGS Osseous: There is no acute facial bone fracture. There is no evidence of mandibular dislocation. There is no suspicious osseous lesion. Orbits: The globes are mildly elongated, likely reflecting sequela of myopia. There is no evidence of traumatic injury. Sinuses: There is mild mucosal thickening in the paranasal sinuses. Soft tissues: There is mild swelling in the right periorbital soft tissues. Thyroid is diffusely heterogeneous, previously evaluated by ultrasound. IMPRESSION: 1. No acute intracranial hemorrhage or calvarial fracture. 2. Mild swelling in the right periorbital soft tissues without underlying fracture. Electronically Signed   By: Valetta Mole M.D.   On: 10/16/2021 12:42    Procedures Procedures    Medications  Ordered in ED Medications  acetaminophen (TYLENOL) tablet 1,000 mg (1,000 mg Oral Given 10/16/21 1232)  ondansetron (ZOFRAN-ODT) disintegrating tablet 4 mg (4 mg Oral Given 10/16/21 1233)    ED Course/ Medical Decision Making/ A&P                           Medical Decision Making Amount and/or Complexity of Data Reviewed Radiology: ordered and independent interpretation performed. Decision-making details documented in ED Course.  Risk OTC drugs. Prescription drug management.   Patient presenting today with a fall after her foot slipped on her wooden floor.  She has injury noted to the right forehead around the eyebrow with hematoma present.  She has no neck pain, mental status is normal and normal neurologic exam.  Patient does not take any anticoagulation.  She is complaining of some mild nausea and was given Zofran and Tylenol.  Imaging is pending.  1:47 PM I independently reviewed and interpreted patient's head CT which shows no acute intracranial hemorrhage.  Radiology reported no in cranial injury and mild swelling in the right periorbital soft tissue based on maxillofacial CT without facial fractures.  Patient has been able to ambulate and again is neurologically intact.  Findings discussed with the patient and her husband.  At this time she does not meet any criteria for further testing or admission.  She was discharged home with her husband.  No social determinants affecting her care today.        Final Clinical Impression(s) / ED Diagnoses Final diagnoses:  Facial contusion, initial encounter  Concussion without loss of consciousness, initial encounter    Rx / DC Orders ED Discharge Orders     None         Blanchie Dessert, MD 10/16/21 1348

## 2021-10-16 NOTE — Discharge Instructions (Addendum)
If you start having confusion, difficulty walking, repetitive vomiting return to the emergency room

## 2021-11-23 DIAGNOSIS — L814 Other melanin hyperpigmentation: Secondary | ICD-10-CM | POA: Diagnosis not present

## 2021-11-23 DIAGNOSIS — Z85828 Personal history of other malignant neoplasm of skin: Secondary | ICD-10-CM | POA: Diagnosis not present

## 2021-11-23 DIAGNOSIS — D225 Melanocytic nevi of trunk: Secondary | ICD-10-CM | POA: Diagnosis not present

## 2021-11-23 DIAGNOSIS — R202 Paresthesia of skin: Secondary | ICD-10-CM | POA: Diagnosis not present

## 2021-11-23 DIAGNOSIS — L821 Other seborrheic keratosis: Secondary | ICD-10-CM | POA: Diagnosis not present

## 2021-11-23 DIAGNOSIS — L578 Other skin changes due to chronic exposure to nonionizing radiation: Secondary | ICD-10-CM | POA: Diagnosis not present

## 2022-03-08 DIAGNOSIS — H31001 Unspecified chorioretinal scars, right eye: Secondary | ICD-10-CM | POA: Diagnosis not present

## 2022-03-08 DIAGNOSIS — H5213 Myopia, bilateral: Secondary | ICD-10-CM | POA: Diagnosis not present

## 2022-03-08 DIAGNOSIS — H2513 Age-related nuclear cataract, bilateral: Secondary | ICD-10-CM | POA: Diagnosis not present

## 2022-04-02 ENCOUNTER — Encounter: Payer: Self-pay | Admitting: Internal Medicine

## 2022-04-14 DIAGNOSIS — F330 Major depressive disorder, recurrent, mild: Secondary | ICD-10-CM | POA: Diagnosis not present

## 2022-04-14 DIAGNOSIS — F411 Generalized anxiety disorder: Secondary | ICD-10-CM | POA: Diagnosis not present

## 2022-04-29 ENCOUNTER — Ambulatory Visit (AMBULATORY_SURGERY_CENTER): Payer: Self-pay | Admitting: *Deleted

## 2022-04-29 VITALS — Ht 63.0 in | Wt 185.6 lb

## 2022-04-29 DIAGNOSIS — Z1211 Encounter for screening for malignant neoplasm of colon: Secondary | ICD-10-CM

## 2022-04-29 MED ORDER — NA SULFATE-K SULFATE-MG SULF 17.5-3.13-1.6 GM/177ML PO SOLN: 1.0000 | Freq: Once | ORAL | 0 refills | Status: AC

## 2022-04-29 NOTE — Progress Notes (Signed)
No egg or soy allergy known to patient  No issues known to pt with past sedation with any surgeries or procedures Patient denies ever being told they had issues or difficulty with intubation  No FH of Malignant Hyperthermia Pt is not on diet pills Pt is not on  home 02  Pt is not on blood thinners  Pt has issues with constipation  No A fib or A flutter Have any cardiac testing pending--no Pt instructed to use [email or web address] or GoodRx for a price reduction on prep   

## 2022-04-30 DIAGNOSIS — M25552 Pain in left hip: Secondary | ICD-10-CM | POA: Diagnosis not present

## 2022-05-04 DIAGNOSIS — F320 Major depressive disorder, single episode, mild: Secondary | ICD-10-CM | POA: Diagnosis not present

## 2022-05-07 ENCOUNTER — Encounter: Payer: Self-pay | Admitting: Internal Medicine

## 2022-05-10 DIAGNOSIS — E2839 Other primary ovarian failure: Secondary | ICD-10-CM | POA: Diagnosis not present

## 2022-05-10 DIAGNOSIS — Z1331 Encounter for screening for depression: Secondary | ICD-10-CM | POA: Diagnosis not present

## 2022-05-10 DIAGNOSIS — Z0000 Encounter for general adult medical examination without abnormal findings: Secondary | ICD-10-CM | POA: Diagnosis not present

## 2022-05-10 DIAGNOSIS — E7800 Pure hypercholesterolemia, unspecified: Secondary | ICD-10-CM | POA: Diagnosis not present

## 2022-05-10 DIAGNOSIS — F419 Anxiety disorder, unspecified: Secondary | ICD-10-CM | POA: Diagnosis not present

## 2022-05-10 DIAGNOSIS — E559 Vitamin D deficiency, unspecified: Secondary | ICD-10-CM | POA: Diagnosis not present

## 2022-05-10 DIAGNOSIS — M7062 Trochanteric bursitis, left hip: Secondary | ICD-10-CM | POA: Diagnosis not present

## 2022-05-10 DIAGNOSIS — E042 Nontoxic multinodular goiter: Secondary | ICD-10-CM | POA: Diagnosis not present

## 2022-05-10 DIAGNOSIS — K5900 Constipation, unspecified: Secondary | ICD-10-CM | POA: Diagnosis not present

## 2022-05-10 DIAGNOSIS — Z23 Encounter for immunization: Secondary | ICD-10-CM | POA: Diagnosis not present

## 2022-05-10 DIAGNOSIS — Z131 Encounter for screening for diabetes mellitus: Secondary | ICD-10-CM | POA: Diagnosis not present

## 2022-05-10 DIAGNOSIS — E669 Obesity, unspecified: Secondary | ICD-10-CM | POA: Diagnosis not present

## 2022-05-18 DIAGNOSIS — F419 Anxiety disorder, unspecified: Secondary | ICD-10-CM | POA: Diagnosis not present

## 2022-05-18 DIAGNOSIS — Z1331 Encounter for screening for depression: Secondary | ICD-10-CM | POA: Diagnosis not present

## 2022-05-18 DIAGNOSIS — Z6832 Body mass index (BMI) 32.0-32.9, adult: Secondary | ICD-10-CM | POA: Diagnosis not present

## 2022-05-18 DIAGNOSIS — K219 Gastro-esophageal reflux disease without esophagitis: Secondary | ICD-10-CM | POA: Diagnosis not present

## 2022-05-18 DIAGNOSIS — E7800 Pure hypercholesterolemia, unspecified: Secondary | ICD-10-CM | POA: Diagnosis not present

## 2022-05-18 DIAGNOSIS — E8889 Other specified metabolic disorders: Secondary | ICD-10-CM | POA: Diagnosis not present

## 2022-05-18 DIAGNOSIS — R7303 Prediabetes: Secondary | ICD-10-CM | POA: Diagnosis not present

## 2022-05-18 DIAGNOSIS — E669 Obesity, unspecified: Secondary | ICD-10-CM | POA: Diagnosis not present

## 2022-05-18 DIAGNOSIS — E559 Vitamin D deficiency, unspecified: Secondary | ICD-10-CM | POA: Diagnosis not present

## 2022-05-18 DIAGNOSIS — R7301 Impaired fasting glucose: Secondary | ICD-10-CM | POA: Diagnosis not present

## 2022-05-18 DIAGNOSIS — R5383 Other fatigue: Secondary | ICD-10-CM | POA: Diagnosis not present

## 2022-05-27 ENCOUNTER — Encounter: Payer: PPO | Admitting: Internal Medicine

## 2022-05-29 ENCOUNTER — Other Ambulatory Visit: Payer: Self-pay | Admitting: Internal Medicine

## 2022-06-02 DIAGNOSIS — E669 Obesity, unspecified: Secondary | ICD-10-CM | POA: Diagnosis not present

## 2022-06-02 DIAGNOSIS — R000 Tachycardia, unspecified: Secondary | ICD-10-CM | POA: Diagnosis not present

## 2022-06-02 DIAGNOSIS — E7800 Pure hypercholesterolemia, unspecified: Secondary | ICD-10-CM | POA: Diagnosis not present

## 2022-06-02 DIAGNOSIS — F419 Anxiety disorder, unspecified: Secondary | ICD-10-CM | POA: Diagnosis not present

## 2022-06-02 DIAGNOSIS — Z6832 Body mass index (BMI) 32.0-32.9, adult: Secondary | ICD-10-CM | POA: Diagnosis not present

## 2022-06-09 ENCOUNTER — Ambulatory Visit (AMBULATORY_SURGERY_CENTER): Payer: PPO

## 2022-06-09 VITALS — Ht 62.0 in | Wt 180.0 lb

## 2022-06-09 DIAGNOSIS — Z1211 Encounter for screening for malignant neoplasm of colon: Secondary | ICD-10-CM

## 2022-06-09 NOTE — Progress Notes (Signed)

## 2022-06-14 DIAGNOSIS — Z6832 Body mass index (BMI) 32.0-32.9, adult: Secondary | ICD-10-CM | POA: Diagnosis not present

## 2022-06-14 DIAGNOSIS — R221 Localized swelling, mass and lump, neck: Secondary | ICD-10-CM | POA: Diagnosis not present

## 2022-06-14 DIAGNOSIS — E559 Vitamin D deficiency, unspecified: Secondary | ICD-10-CM | POA: Diagnosis not present

## 2022-06-14 DIAGNOSIS — R000 Tachycardia, unspecified: Secondary | ICD-10-CM | POA: Diagnosis not present

## 2022-06-16 DIAGNOSIS — R7303 Prediabetes: Secondary | ICD-10-CM | POA: Diagnosis not present

## 2022-06-16 DIAGNOSIS — Z6831 Body mass index (BMI) 31.0-31.9, adult: Secondary | ICD-10-CM | POA: Diagnosis not present

## 2022-06-16 DIAGNOSIS — F419 Anxiety disorder, unspecified: Secondary | ICD-10-CM | POA: Diagnosis not present

## 2022-06-16 DIAGNOSIS — E669 Obesity, unspecified: Secondary | ICD-10-CM | POA: Diagnosis not present

## 2022-06-18 ENCOUNTER — Other Ambulatory Visit: Payer: Self-pay | Admitting: Internal Medicine

## 2022-07-01 DIAGNOSIS — E669 Obesity, unspecified: Secondary | ICD-10-CM | POA: Diagnosis not present

## 2022-07-01 DIAGNOSIS — E559 Vitamin D deficiency, unspecified: Secondary | ICD-10-CM | POA: Diagnosis not present

## 2022-07-01 DIAGNOSIS — R7303 Prediabetes: Secondary | ICD-10-CM | POA: Diagnosis not present

## 2022-07-01 DIAGNOSIS — Z6831 Body mass index (BMI) 31.0-31.9, adult: Secondary | ICD-10-CM | POA: Diagnosis not present

## 2022-07-05 ENCOUNTER — Encounter: Payer: Self-pay | Admitting: Internal Medicine

## 2022-07-08 ENCOUNTER — Encounter: Payer: Self-pay | Admitting: Internal Medicine

## 2022-07-08 ENCOUNTER — Ambulatory Visit (AMBULATORY_SURGERY_CENTER): Payer: PPO | Admitting: Internal Medicine

## 2022-07-08 VITALS — BP 141/64 | HR 62 | Temp 97.7°F | Resp 10 | Ht 62.0 in | Wt 180.0 lb

## 2022-07-08 DIAGNOSIS — F419 Anxiety disorder, unspecified: Secondary | ICD-10-CM | POA: Diagnosis not present

## 2022-07-08 DIAGNOSIS — Z1211 Encounter for screening for malignant neoplasm of colon: Secondary | ICD-10-CM

## 2022-07-08 MED ORDER — SODIUM CHLORIDE 0.9 % IV SOLN: 500.0000 mL | Freq: Once | INTRAVENOUS | Status: DC

## 2022-07-08 NOTE — Patient Instructions (Addendum)
Handout provided about diverticulosis.  Resume previous diet.  Continue present medications.  Repeat colonoscopy in 10 years for surveillance.    YOU HAD AN ENDOSCOPIC PROCEDURE TODAY AT [facility]:   Refer to the procedure report that was given to you for any specific questions about what was found during the examination.  If the procedure report does not answer your questions, please call your gastroenterologist to clarify.  If you requested that your care partner not be given the details of your procedure findings, then the procedure report has been included in a sealed envelope for you to review at your convenience later.  YOU SHOULD EXPECT: Some feelings of bloating in the abdomen. Passage of more gas than usual.  Walking can help get rid of the air that was put into your GI tract during the procedure and reduce the bloating. If you had a lower endoscopy (such as a colonoscopy or flexible sigmoidoscopy) you may notice spotting of blood in your stool or on the toilet paper. If you underwent a bowel prep for your procedure, you may not have a normal bowel movement for a few days.  Please Note:  You might notice some irritation and congestion in your nose or some drainage.  This is from the oxygen used during your procedure.  There is no need for concern and it should clear up in a day or so.  SYMPTOMS TO REPORT IMMEDIATELY:   YOU HAD AN ENDOSCOPIC PROCEDURE TODAY AT [facility]:   Refer to the procedure report that was given to you for any specific questions about what was found during the examination.  If the procedure report does not answer your questions, please call your gastroenterologist to clarify.  If you requested that your care partner not be given the details of your procedure findings, then the procedure report has been included in a sealed envelope for you to review at your convenience later.  YOU SHOULD EXPECT: Some feelings of bloating in the abdomen.  Passage of more gas than usual.  Walking can help get rid of the air that was put into your GI tract during the procedure and reduce the bloating. If you had a lower endoscopy (such as a colonoscopy or flexible sigmoidoscopy) you may notice spotting of blood in your stool or on the toilet paper. If you underwent a bowel prep for your procedure, you may not have a normal bowel movement for a few days.  Please Note:  You might notice some irritation and congestion in your nose or some drainage.  This is from the oxygen used during your procedure.  There is no need for concern and it should clear up in a day or so.  SYMPTOMS TO REPORT IMMEDIATELY:  Following lower endoscopy (colonoscopy or flexible sigmoidoscopy):  Excessive amounts of blood in the stool  Significant tenderness or worsening of abdominal pains  Swelling of the abdomen that is new, acute  Fever of 100F or higher  For urgent or emergent issues, a gastroenterologist can be reached at any hour by calling [phone]. Do not use MyChart messaging for urgent concerns.    DIET:  We do recommend a small meal at first, but then you may proceed to your regular diet.  Drink plenty of fluids but you should avoid alcoholic beverages for 24 hours.  ACTIVITY:  You should plan to take it easy for the rest of today and you should NOT DRIVE or use heavy machinery until tomorrow (because  of the sedation medicines used during the test).    FOLLOW UP: Our staff will call the number listed on your records the next business day following your procedure.  We will call around 7:15- 8:00 am to check on you and address any questions or concerns that you may have regarding the information given to you following your procedure. If we do not reach you, we will leave a message.     If any biopsies were taken you will be contacted by phone or by letter within the next 1-3 weeks.  Please call us at [phone] if you have not heard about the biopsies in 3  weeks.    SIGNATURES/CONFIDENTIALITY: You and/or your care partner have signed paperwork which will be entered into your electronic medical record.  These signatures attest to the fact that that the information above on your After Visit Summary has been reviewed and is understood.  Full responsibility of the confidentiality of this discharge information lies with you and/or your care-partner. For urgent or emergent issues, a gastroenterologist can be reached at any hour by calling [phone]. Do not use MyChart messaging for urgent concerns.    DIET:  We do recommend a small meal at first, but then you may proceed to your regular diet.  Drink plenty of fluids but you should avoid alcoholic beverages for 24 hours.  ACTIVITY:  You should plan to take it easy for the rest of today and you should NOT DRIVE or use heavy machinery until tomorrow (because of the sedation medicines used during the test).    FOLLOW UP: Our staff will call the number listed on your records the next business day following your procedure.  We will call around 7:15- 8:00 am to check on you and address any questions or concerns that you may have regarding the information given to you following your procedure. If we do not reach you, we will leave a message.     If any biopsies were taken you will be contacted by phone or by letter within the next 1-3 weeks.  Please call us at [phone] if you have not heard about the biopsies in 3 weeks.    SIGNATURES/CONFIDENTIALITY: You and/or your care partner have signed paperwork which will be entered into your electronic medical record.  These signatures attest to the fact that that the information above on your After Visit Summary has been reviewed and is understood.  Full responsibility of the confidentiality of this discharge information lies with you and/or your care-partner.

## 2022-07-08 NOTE — Op Note (Signed)
[facility] Patient Name: Sandra Gonzales Procedure Date: 07/08/2022 2:05 PM MRN: [id number] Endoscopist: Docia Chuck. Henrene Pastor , MD, [id number] Age: 66 Referring MD:  Date of Birth: [birth year] Gender: Female Account #: [id number] Procedure:                Colonoscopy Indications:              Screening for colorectal malignant neoplasm.                            Negative index exam 2008 and follow-up exam 2013. Medicines:                Monitored Anesthesia Care Procedure:                Pre-Anesthesia Assessment:                           - Prior to the procedure, a History and Physical                            was performed, and patient medications and                            allergies were reviewed. The patient's tolerance of                            previous anesthesia was also reviewed. The risks                            and benefits of the procedure and the sedation                            options and risks were discussed with the patient.                            All questions were answered, and informed consent                            was obtained. Prior Anticoagulants: The patient has                            taken no anticoagulant or antiplatelet agents. ASA                            Grade Assessment: II - A patient with mild systemic                            disease. After reviewing the risks and benefits,                            the patient was deemed in satisfactory condition to                            undergo the procedure.  After obtaining informed consent, the colonoscope                            was passed under direct vision. Throughout the                            procedure, the patient's blood pressure, pulse, and                            oxygen saturations were monitored continuously. The                            CF [id number] #[id number] was introduced through the anus                            and  advanced to the the cecum, identified by                            appendiceal orifice and ileocecal valve. The                            ileocecal valve, appendiceal orifice, and rectum                            were photographed. The quality of the bowel                            preparation was excellent. The colonoscopy was                            performed without difficulty. The patient tolerated                            the procedure well. The bowel preparation used was                            SUPREP via split dose instruction. Scope In: 2:20:30 PM Scope Out: 2:34:37 PM Scope Withdrawal Time: 0 hours 11 minutes 19 seconds  Total Procedure Duration: 0 hours 14 minutes 7 seconds  Findings:                 Multiple diverticula were found in the left colon.                           A diffuse area of moderate melanosis was found in                            the entire colon.                           Internal hemorrhoids were found during                            retroflexion. The hemorrhoids were small.  The exam was otherwise without abnormality on                            direct and retroflexion views. Complications:            No immediate complications. Estimated blood loss:                            None. Estimated Blood Loss:     Estimated blood loss: none. Impression:               - Diverticulosis in the left colon.                           - Melanosis in the colon.                           - Internal hemorrhoids.                           - The examination was otherwise normal on direct                            and retroflexion views.                           - No specimens collected. Recommendation:           - Repeat colonoscopy in 10 years for screening                            purposes.                           - Patient has a contact number available for                            emergencies. The signs and symptoms  of potential                            delayed complications were discussed with the                            patient. Return to normal activities tomorrow.                            Written discharge instructions were provided to the                            patient.                           - Resume previous diet.                           - Continue present medications. Docia Chuck. Henrene Pastor, MD 07/08/2022 2:40:49 PM This report has been signed electronically.

## 2022-07-08 NOTE — Progress Notes (Signed)
Sedate, gd SR, tolerated procedure well, VSS, report to RN 

## 2022-07-08 NOTE — Progress Notes (Signed)
HISTORY OF PRESENT ILLNESS:  Sandra Gonzales is a 66 y.o. female who presents today for screening colonoscopy.  Previous examinations 2008 and 2013 were negative for neoplasia.  No complaints  REVIEW OF SYSTEMS:  All non-GI ROS negative. Past Medical History:  Diagnosis Date   Allergy    hay fever   Anxiety    Arthritis    HANDS   Benign thyroid cyst    previously drained, has not recurred   Cancer ([place])    BCC on L lower eyelid, R nasal bridge &back   Cataract    BILATERAL BEGINNING   Constipation    Diverticulosis of colon (without mention of hemorrhage) 2008   DVT (deep venous thrombosis) ([facility])    Endometriosis    GERD (gastroesophageal reflux disease)    Hepatic cyst    Hx of colonic polyps 2008   Hyperplastic    Hyperlipidemia 11/25/2010   Left ankle sprain 03/06/2012   Multinodular goiter    Overweight(278.02)    Vitamin D deficiency     Past Surgical History:  Procedure Laterality Date   ABDOMINAL HYSTERECTOMY  01/22/2011   still has ovaries   basal cell left lower eyelid  04/23/2010   bcc removed from R nasal bridge and back also   basel cell removed Left    lower left leg   COLONOSCOPY     ENDOMETRIAL ABLATION  8 years ago   For clotting, no further bleeding   THYROID CYST EXCISION     aspirated   torn miniscus left knee  08/24/2007    Social History Sandra Gonzales  reports that she has never smoked. She has been exposed to tobacco smoke. She has never used smokeless tobacco. She reports that she does not drink alcohol and does not use drugs.  family history includes Cancer in her brother; Diabetes in her brother, father, and paternal grandmother; Leukemia in her paternal grandfather; Lung cancer in her mother; Other in her father.  Allergies  Allergen Reactions   Sulfa Antibiotics Other (See Comments)    hallunations   Sulfa Drugs Cross Reactors     hallucinations       PHYSICAL EXAMINATION: Vital signs: BP 136/60   Pulse 73   Temp  97.7 F (36.5 C) (Temporal)   Ht '5\' 2"'$  (1.575 m)   Wt 180 lb (81.6 kg)   SpO2 99%   BMI 32.92 kg/m  General: Well-developed, well-nourished, no acute distress HEENT: Sclerae are anicteric, conjunctiva pink. Oral mucosa intact Lungs: Clear Heart: Regular Abdomen: soft, nontender, nondistended, no obvious ascites, no peritoneal signs, normal bowel sounds. No organomegaly. Extremities: No edema Psychiatric: alert and oriented x3. Cooperative     ASSESSMENT:  Colon cancer screening   PLAN:   Screening colonoscopy

## 2022-07-09 ENCOUNTER — Telehealth: Payer: Self-pay

## 2022-07-09 NOTE — Telephone Encounter (Signed)
  Follow up Call-     07/08/2022    1:20 PM  Call back number  Post procedure Call Back phone  # [phone]  Permission to leave phone message Yes     Patient questions:  Do you have a fever, pain , or abdominal swelling? No. Pain Score  0 *  Have you tolerated food without any problems? Yes.    Have you been able to return to your normal activities? Yes.    Do you have any questions about your discharge instructions: Diet   No. Medications  No. Follow up visit  No.  Do you have questions or concerns about your Care? No.  Actions: * If pain score is 4 or above: No action needed, pain <4.

## 2022-08-12 DIAGNOSIS — M8589 Other specified disorders of bone density and structure, multiple sites: Secondary | ICD-10-CM | POA: Diagnosis not present

## 2022-08-12 DIAGNOSIS — Z803 Family history of malignant neoplasm of breast: Secondary | ICD-10-CM | POA: Diagnosis not present

## 2022-08-12 DIAGNOSIS — Z780 Asymptomatic menopausal state: Secondary | ICD-10-CM | POA: Diagnosis not present

## 2022-08-12 DIAGNOSIS — Z1231 Encounter for screening mammogram for malignant neoplasm of breast: Secondary | ICD-10-CM | POA: Diagnosis not present

## 2022-08-21 DIAGNOSIS — N3000 Acute cystitis without hematuria: Secondary | ICD-10-CM | POA: Diagnosis not present

## 2022-08-21 DIAGNOSIS — R300 Dysuria: Secondary | ICD-10-CM | POA: Diagnosis not present

## 2022-08-31 DIAGNOSIS — N390 Urinary tract infection, site not specified: Secondary | ICD-10-CM | POA: Diagnosis not present

## 2022-12-15 DIAGNOSIS — D225 Melanocytic nevi of trunk: Secondary | ICD-10-CM | POA: Diagnosis not present

## 2022-12-15 DIAGNOSIS — L578 Other skin changes due to chronic exposure to nonionizing radiation: Secondary | ICD-10-CM | POA: Diagnosis not present

## 2022-12-15 DIAGNOSIS — C44519 Basal cell carcinoma of skin of other part of trunk: Secondary | ICD-10-CM | POA: Diagnosis not present

## 2022-12-15 DIAGNOSIS — L820 Inflamed seborrheic keratosis: Secondary | ICD-10-CM | POA: Diagnosis not present

## 2022-12-15 DIAGNOSIS — D485 Neoplasm of uncertain behavior of skin: Secondary | ICD-10-CM | POA: Diagnosis not present

## 2022-12-15 DIAGNOSIS — L821 Other seborrheic keratosis: Secondary | ICD-10-CM | POA: Diagnosis not present

## 2022-12-15 DIAGNOSIS — L814 Other melanin hyperpigmentation: Secondary | ICD-10-CM | POA: Diagnosis not present

## 2022-12-15 DIAGNOSIS — B009 Herpesviral infection, unspecified: Secondary | ICD-10-CM | POA: Diagnosis not present

## 2022-12-15 DIAGNOSIS — Z85828 Personal history of other malignant neoplasm of skin: Secondary | ICD-10-CM | POA: Diagnosis not present

## 2022-12-16 DIAGNOSIS — Z79899 Other long term (current) drug therapy: Secondary | ICD-10-CM | POA: Diagnosis not present

## 2022-12-16 DIAGNOSIS — E8889 Other specified metabolic disorders: Secondary | ICD-10-CM | POA: Diagnosis not present

## 2022-12-16 DIAGNOSIS — K219 Gastro-esophageal reflux disease without esophagitis: Secondary | ICD-10-CM | POA: Diagnosis not present

## 2022-12-16 DIAGNOSIS — Z23 Encounter for immunization: Secondary | ICD-10-CM | POA: Diagnosis not present

## 2022-12-16 DIAGNOSIS — R7303 Prediabetes: Secondary | ICD-10-CM | POA: Diagnosis not present

## 2022-12-16 DIAGNOSIS — E0590 Thyrotoxicosis, unspecified without thyrotoxic crisis or storm: Secondary | ICD-10-CM | POA: Diagnosis not present

## 2022-12-16 DIAGNOSIS — E042 Nontoxic multinodular goiter: Secondary | ICD-10-CM | POA: Diagnosis not present

## 2022-12-16 DIAGNOSIS — Z6832 Body mass index (BMI) 32.0-32.9, adult: Secondary | ICD-10-CM | POA: Diagnosis not present

## 2022-12-16 DIAGNOSIS — E7800 Pure hypercholesterolemia, unspecified: Secondary | ICD-10-CM | POA: Diagnosis not present

## 2022-12-16 DIAGNOSIS — E6609 Other obesity due to excess calories: Secondary | ICD-10-CM | POA: Diagnosis not present

## 2022-12-16 DIAGNOSIS — E559 Vitamin D deficiency, unspecified: Secondary | ICD-10-CM | POA: Diagnosis not present

## 2023-01-20 DIAGNOSIS — C44519 Basal cell carcinoma of skin of other part of trunk: Secondary | ICD-10-CM | POA: Diagnosis not present

## 2023-03-09 DIAGNOSIS — H5213 Myopia, bilateral: Secondary | ICD-10-CM | POA: Diagnosis not present

## 2023-03-09 DIAGNOSIS — H2513 Age-related nuclear cataract, bilateral: Secondary | ICD-10-CM | POA: Diagnosis not present

## 2023-03-09 DIAGNOSIS — H35351 Cystoid macular degeneration, right eye: Secondary | ICD-10-CM | POA: Diagnosis not present

## 2023-03-10 ENCOUNTER — Other Ambulatory Visit: Payer: Self-pay | Admitting: Internal Medicine

## 2023-04-05 DIAGNOSIS — U071 COVID-19: Secondary | ICD-10-CM | POA: Diagnosis not present

## 2023-04-13 DIAGNOSIS — R051 Acute cough: Secondary | ICD-10-CM | POA: Diagnosis not present

## 2023-04-13 DIAGNOSIS — E559 Vitamin D deficiency, unspecified: Secondary | ICD-10-CM | POA: Diagnosis not present

## 2023-04-13 DIAGNOSIS — Z6832 Body mass index (BMI) 32.0-32.9, adult: Secondary | ICD-10-CM | POA: Diagnosis not present

## 2023-04-13 DIAGNOSIS — U071 COVID-19: Secondary | ICD-10-CM | POA: Diagnosis not present

## 2023-05-12 DIAGNOSIS — Z0000 Encounter for general adult medical examination without abnormal findings: Secondary | ICD-10-CM | POA: Diagnosis not present

## 2023-05-12 DIAGNOSIS — H3530 Unspecified macular degeneration: Secondary | ICD-10-CM | POA: Diagnosis not present

## 2023-05-12 DIAGNOSIS — R7303 Prediabetes: Secondary | ICD-10-CM | POA: Diagnosis not present

## 2023-05-12 DIAGNOSIS — E559 Vitamin D deficiency, unspecified: Secondary | ICD-10-CM | POA: Diagnosis not present

## 2023-05-12 DIAGNOSIS — E7800 Pure hypercholesterolemia, unspecified: Secondary | ICD-10-CM | POA: Diagnosis not present

## 2023-05-12 DIAGNOSIS — M7062 Trochanteric bursitis, left hip: Secondary | ICD-10-CM | POA: Diagnosis not present

## 2023-05-12 DIAGNOSIS — E669 Obesity, unspecified: Secondary | ICD-10-CM | POA: Diagnosis not present

## 2023-05-12 DIAGNOSIS — K219 Gastro-esophageal reflux disease without esophagitis: Secondary | ICD-10-CM | POA: Diagnosis not present

## 2023-05-12 DIAGNOSIS — U071 COVID-19: Secondary | ICD-10-CM | POA: Diagnosis not present

## 2023-05-12 DIAGNOSIS — Z23 Encounter for immunization: Secondary | ICD-10-CM | POA: Diagnosis not present

## 2023-05-12 DIAGNOSIS — Z6833 Body mass index (BMI) 33.0-33.9, adult: Secondary | ICD-10-CM | POA: Diagnosis not present

## 2023-05-12 DIAGNOSIS — Z1331 Encounter for screening for depression: Secondary | ICD-10-CM | POA: Diagnosis not present

## 2023-06-08 DIAGNOSIS — E042 Nontoxic multinodular goiter: Secondary | ICD-10-CM | POA: Diagnosis not present

## 2023-06-08 DIAGNOSIS — E7800 Pure hypercholesterolemia, unspecified: Secondary | ICD-10-CM | POA: Diagnosis not present

## 2023-06-08 DIAGNOSIS — E0590 Thyrotoxicosis, unspecified without thyrotoxic crisis or storm: Secondary | ICD-10-CM | POA: Diagnosis not present

## 2023-06-08 DIAGNOSIS — E559 Vitamin D deficiency, unspecified: Secondary | ICD-10-CM | POA: Diagnosis not present

## 2023-06-08 DIAGNOSIS — R7303 Prediabetes: Secondary | ICD-10-CM | POA: Diagnosis not present

## 2023-08-02 DIAGNOSIS — M7062 Trochanteric bursitis, left hip: Secondary | ICD-10-CM | POA: Diagnosis not present

## 2023-12-15 ENCOUNTER — Other Ambulatory Visit: Payer: Self-pay | Admitting: Internal Medicine

## 2023-12-21 DIAGNOSIS — L578 Other skin changes due to chronic exposure to nonionizing radiation: Secondary | ICD-10-CM | POA: Diagnosis not present

## 2023-12-21 DIAGNOSIS — I788 Other diseases of capillaries: Secondary | ICD-10-CM | POA: Diagnosis not present

## 2023-12-21 DIAGNOSIS — D225 Melanocytic nevi of trunk: Secondary | ICD-10-CM | POA: Diagnosis not present

## 2023-12-21 DIAGNOSIS — L814 Other melanin hyperpigmentation: Secondary | ICD-10-CM | POA: Diagnosis not present

## 2023-12-21 DIAGNOSIS — Z85828 Personal history of other malignant neoplasm of skin: Secondary | ICD-10-CM | POA: Diagnosis not present

## 2023-12-21 DIAGNOSIS — D485 Neoplasm of uncertain behavior of skin: Secondary | ICD-10-CM | POA: Diagnosis not present

## 2023-12-21 DIAGNOSIS — L821 Other seborrheic keratosis: Secondary | ICD-10-CM | POA: Diagnosis not present

## 2024-02-07 DIAGNOSIS — L570 Actinic keratosis: Secondary | ICD-10-CM | POA: Diagnosis not present

## 2024-02-07 DIAGNOSIS — D485 Neoplasm of uncertain behavior of skin: Secondary | ICD-10-CM | POA: Diagnosis not present

## 2024-03-13 DIAGNOSIS — H5213 Myopia, bilateral: Secondary | ICD-10-CM | POA: Diagnosis not present

## 2024-03-13 DIAGNOSIS — H31001 Unspecified chorioretinal scars, right eye: Secondary | ICD-10-CM | POA: Diagnosis not present

## 2024-03-13 DIAGNOSIS — H2513 Age-related nuclear cataract, bilateral: Secondary | ICD-10-CM | POA: Diagnosis not present

## 2024-03-13 DIAGNOSIS — H35371 Puckering of macula, right eye: Secondary | ICD-10-CM | POA: Diagnosis not present

## 2024-05-15 DIAGNOSIS — Z0000 Encounter for general adult medical examination without abnormal findings: Secondary | ICD-10-CM | POA: Diagnosis not present

## 2024-05-15 DIAGNOSIS — M159 Polyosteoarthritis, unspecified: Secondary | ICD-10-CM | POA: Diagnosis not present

## 2024-05-15 DIAGNOSIS — E7800 Pure hypercholesterolemia, unspecified: Secondary | ICD-10-CM | POA: Diagnosis not present

## 2024-05-15 DIAGNOSIS — E66811 Obesity, class 1: Secondary | ICD-10-CM | POA: Diagnosis not present

## 2024-05-15 DIAGNOSIS — Z79899 Other long term (current) drug therapy: Secondary | ICD-10-CM | POA: Diagnosis not present

## 2024-05-15 DIAGNOSIS — E0590 Thyrotoxicosis, unspecified without thyrotoxic crisis or storm: Secondary | ICD-10-CM | POA: Diagnosis not present

## 2024-05-15 DIAGNOSIS — M7061 Trochanteric bursitis, right hip: Secondary | ICD-10-CM | POA: Diagnosis not present

## 2024-05-15 DIAGNOSIS — M7062 Trochanteric bursitis, left hip: Secondary | ICD-10-CM | POA: Diagnosis not present

## 2024-05-15 DIAGNOSIS — E042 Nontoxic multinodular goiter: Secondary | ICD-10-CM | POA: Diagnosis not present

## 2024-05-15 DIAGNOSIS — R7303 Prediabetes: Secondary | ICD-10-CM | POA: Diagnosis not present

## 2024-05-15 DIAGNOSIS — Z6833 Body mass index (BMI) 33.0-33.9, adult: Secondary | ICD-10-CM | POA: Diagnosis not present

## 2024-05-15 DIAGNOSIS — Z23 Encounter for immunization: Secondary | ICD-10-CM | POA: Diagnosis not present

## 2024-05-15 DIAGNOSIS — E559 Vitamin D deficiency, unspecified: Secondary | ICD-10-CM | POA: Diagnosis not present

## 2024-07-16 DIAGNOSIS — K120 Recurrent oral aphthae: Secondary | ICD-10-CM | POA: Diagnosis not present

## 2024-09-07 ENCOUNTER — Other Ambulatory Visit: Payer: Self-pay | Admitting: Internal Medicine
